# Patient Record
Sex: Male | Born: 1940
Health system: Southern US, Community
[De-identification: ages and names within clinical notes are randomized; demographics above are authoritative.]

## PROBLEM LIST (undated history)

## (undated) DIAGNOSIS — K219 Gastro-esophageal reflux disease without esophagitis: Secondary | ICD-10-CM

## (undated) DIAGNOSIS — I714 Abdominal aortic aneurysm, without rupture, unspecified: Secondary | ICD-10-CM

## (undated) DIAGNOSIS — I25119 Atherosclerotic heart disease of native coronary artery with unspecified angina pectoris: Secondary | ICD-10-CM

## (undated) DIAGNOSIS — E785 Hyperlipidemia, unspecified: Secondary | ICD-10-CM

## (undated) DIAGNOSIS — Z951 Presence of aortocoronary bypass graft: Secondary | ICD-10-CM

## (undated) DIAGNOSIS — R079 Chest pain, unspecified: Secondary | ICD-10-CM

## (undated) DIAGNOSIS — R0602 Shortness of breath: Secondary | ICD-10-CM

## (undated) DIAGNOSIS — R001 Bradycardia, unspecified: Secondary | ICD-10-CM

## (undated) DIAGNOSIS — I10 Essential (primary) hypertension: Secondary | ICD-10-CM

## (undated) DIAGNOSIS — I639 Cerebral infarction, unspecified: Secondary | ICD-10-CM

## (undated) DIAGNOSIS — K5090 Crohn's disease, unspecified, without complications: Secondary | ICD-10-CM

## (undated) DIAGNOSIS — J45909 Unspecified asthma, uncomplicated: Secondary | ICD-10-CM

## (undated) HISTORY — DX: Abdominal aortic aneurysm, without rupture, unspecified: I71.40

## (undated) HISTORY — DX: Presence of aortocoronary bypass graft: Z95.1

## (undated) HISTORY — PX: CARDIAC CATHETERIZATION: SHX172

## (undated) HISTORY — PX: CAROTID ENDARTERECTOMY: SUR193

## (undated) HISTORY — PX: CHOLECYSTECTOMY: SHX55

## (undated) HISTORY — DX: Bradycardia, unspecified: R00.1

## (undated) HISTORY — DX: Gastro-esophageal reflux disease without esophagitis: K21.9

## (undated) HISTORY — DX: Abdominal aortic aneurysm, without rupture: I71.4

## (undated) HISTORY — DX: Cerebral infarction, unspecified: I63.9

## (undated) HISTORY — DX: Hyperlipidemia, unspecified: E78.5

## (undated) HISTORY — PX: CORONARY ARTERY BYPASS GRAFT: SHX141

## (undated) HISTORY — PX: HERNIA REPAIR: SHX51

## (undated) HISTORY — DX: Shortness of breath: R06.02

## (undated) HISTORY — DX: Atherosclerotic heart disease of native coronary artery with unspecified angina pectoris: I25.119

## (undated) HISTORY — DX: Crohn's disease, unspecified, without complications: K50.90

## (undated) HISTORY — DX: Chest pain, unspecified: R07.9

## (undated) HISTORY — DX: Unspecified asthma, uncomplicated: J45.909

## (undated) HISTORY — DX: Essential (primary) hypertension: I10

---

## 1998-07-15 ENCOUNTER — Encounter: Payer: Self-pay | Admitting: Gastroenterology

## 1998-07-15 ENCOUNTER — Ambulatory Visit (HOSPITAL_COMMUNITY): Admission: RE | Admit: 1998-07-15 | Discharge: 1998-07-15 | Payer: Self-pay | Admitting: Gastroenterology

## 2002-01-22 ENCOUNTER — Encounter: Payer: Self-pay | Admitting: Orthopaedic Surgery

## 2002-01-25 ENCOUNTER — Observation Stay (HOSPITAL_COMMUNITY): Admission: RE | Admit: 2002-01-25 | Discharge: 2002-01-26 | Payer: Self-pay | Admitting: Orthopaedic Surgery

## 2002-01-25 ENCOUNTER — Encounter: Payer: Self-pay | Admitting: Orthopaedic Surgery

## 2008-07-29 ENCOUNTER — Encounter: Admission: RE | Admit: 2008-07-29 | Discharge: 2008-08-06 | Payer: Self-pay | Admitting: Neurology

## 2010-11-19 NOTE — Op Note (Signed)
[facility]  Patient:    James Armstrong, James Armstrong Visit Number: [id number] MRN: [id number]          Service Type: SUR Location: [id number] [id number] Attending Physician:  Patricia Nettle Dictated by:   Patricia Nettle, M.D. Proc. Date: 01/25/02 Admit Date:  01/25/2002 Discharge Date: 01/26/2002                             Operative Report  DATE OF BIRTH:  August 29, 1940  PREOPERATIVE DIAGNOSES:  Right L4-5 disk herniation and lateral recess stenosis with radiculopathy.  POSTOPERATIVE DIAGNOSES:  Right L4-5 disk herniation and lateral recess stenosis with radiculopathy.  PROCEDURE:  Right L4-5 microdiskectomy with lateral recess decompression.  SURGEON:  Patricia Nettle, M.D.  ASSISTANT:  Maud Deed, P.A.  COMPLICATIONS:  None.  ANESTHESIA:  General endotracheal.  INDICATIONS FOR PROCEDURE:  The patient is a 70 year old right hand dominant male who was bending over to pick a cord up off the floor and developed the sudden onset of back pain with right lower extremity radiculopathy. He initially treated with his primary physician trying physical therapy, anti-inflammatories and pain medication without significant relief. On physical examination, he was found to have a sciatic list to the right. He had pain with extension. The sciatic notch was tender. He had some give way weakness in the right Achilles. Reflexes were +1 at the knees and absent at the ankles. Straight leg raising was positive on the right. There was also a positive contralateral straight leg with right lower extremity pain. His plain radiographs show a lumbarized S1 segment. MRI scan showed a bulging disk on the right with lateral recessed stenosis and I am going to refer to this level as L4-5 which is the second to lowest caudal motion segment. He was sent for a selective nerve root injection on the right at the L4-5 level. This gave him a short period of relief although his symptoms returned. The  risks, benefits, and alternatives to microsurgical diskectomy with lateral recess decompression were reviewed extensively with the patient and he elected to proceed.  DESCRIPTION OF PROCEDURE:  The patient was properly identified in the holding area and taken to the operating room. He underwent general endotracheal anesthesia without difficulty. He was given prophylactic IV antibiotics. He was turned prone onto the Acromed four-poster positioning frame. His back was prepped and draped in the usual sterile fashion. All bony prominences were padded. The face and eyes were protected at all times. A 3 cm incision was made over the L4-5 level. Dissection was carried down to the deep fascia. The deep fascia was incised and the paraspinal muscles were stripped out of the facet joint at the L4-5 level. Care was taken to protect the facet joint capsule. We placed a curette under the lamina, took an x-ray confirming that this was under the L3 lamina. We then worked down identifying the L4-5 interspace. The microscope was then draped and brought onto the surgical field. Using a high speed bur, we removed the inferior third of the L4 lamina as well as the medial 1/3 of the L4-5 facet joint. The overhanging superior facet of L5 was then removed using a Kerrison rongeur. The ligamentum flavum was released and the nerve root was identified. It clearly was being compressed between the bulging disk anteriorly and the overhanging articular process. There was some discoloration to the nerve. We gently retracted the nerve medially, identified the  disk. Epidural bleeders were coagulated using bipolar cautery. The disk was incised and pituitary rongeurs were utilized to remove disk material. We used a hockey stick to confirm that the nerve was patent all the way out to the foramen. We irrigated the disk space with an angiocath floating above two small fragments and these were removed with the pituitary. When we  were done, the nerve was completely decompressed from its takeoff all the way out the foramen. We controlled all bleeding with bipolar electrocautery and Gelfoam. We placed 100 mcg of fentanyl directly in the epidural space. Gelfoam was placed over the nerve root. The deep fascia was closed with a running #1 Vicryl. The subcutaneous layer was closed with 2-0 Vicryl followed by a running 4-0 subcuticular Vicryl stitch. Benzoin and Steri-Strips were placed. A sterile dressing was applied. The patient was turned supine, extubated, able to wiggle his toes when he was returned to the recovery room in stable condition. Dictated by:   Patricia Nettle, M.D. Attending Physician:  Patricia Nettle. DD:  01/25/02 TD:  01/29/02 Job: [id number]

## 2010-11-19 NOTE — H&P (Signed)
[facility]  Patient:    James Armstrong, James Armstrong Visit Number: [id number] MRN: [id number]          Service Type: Attending:  Patricia Nettle, M.D. Dictated by:   Ralene Bathe, P.A.                           History and Physical  DATE OF BIRTH:  1940-08-06  CHIEF COMPLAINT:  Back and right leg pain.  HISTORY OF PRESENT ILLNESS:  The patient is a 70 year old male who has had approximately a three-month history of back and right leg pain following an incident at work where he bent over, felt a sudden onset of pain, and radiation into the right buttock.  He has had continued conservative management.  He continues to exhibit straight leg raise positive on the right side.  His MRI has demonstrated a right L5-S1 disk herniation with lateral recess stenosis also noted.  There is S1 radiculopathy noted.  He has had a selective nerve root block which proved to be of no benefit.  At this time he wishes to proceed with surgical intervention as his pain is difficult to control and is affecting his activities of daily living.  The risks and benefits were discussed, and he wishes to proceed.  PAST MEDICAL HISTORY: 1. Hypercholesterolemia. 2. Hypertension. 3. Peripheral vascular disease, status post right carotid endarterectomy. 4. GERD.  PAST SURGICAL HISTORY:  Right carotid endarterectomy.  MEDICATIONS: 1. Lisinopril 10 mg q.d. 2. Lipitor 40 mg q.d. 3. Prevacid 15 mg q.d.  ALLERGIES:  No known drug allergies.  SOCIAL HISTORY:  Does not smoke nor drink.  He is married.  Will have some help postoperatively.  FAMILY HISTORY:  Negative for cardiac, diabetes, or cancer.  REVIEW OF SYSTEMS:  The patient denies any recent fevers, chills, night sweats.  No bleeding tendencies.  CNS:  No blurred vision, headaches, seizures, paralysis.  RESPIRATORY:  No shortness of breath, productive cough, hemoptysis.   CARDIOVASCULAR:  No chest pain, angina,  vomiting. GASTROINTESTINAL:  No nausea, vomiting, constipation, melena, bloody stools. GENITOURINARY:  No dysuria, no hematuria.  MUSCULOSKELETAL:  As pertinent to present illness.  PHYSICAL EXAMINATION:  GENERAL:  Well-developed, well-nourished 70 year old male.  VITAL SIGNS:  Blood pressure 146/68 today.  HEENT:  Normocephalic.  Extraocular motions intact.  NECK:  Supple.  No lymphadenopathy.  No carotid bruits appreciated bilaterally.  There is a well-healed scar on the right side there.  CHEST:  Clear to auscultation bilaterally.  No rales, no rhonchi.  HEART:  Regular rate and rhythm.  No murmurs, gallops, or rubs.  ABDOMEN:  Positive bowel sounds.  Soft and nontender.  EXTREMITIES:  ______.  IMPRESSION: 1. Herniated nucleus pulposus. 2. Hypercholesterolemia. 3. Hypertension. 4. Peripheral vascular disease, status post right carotid endarterectomy. 5. Gastroesophageal reflux disease.  PLAN:  Microdiskectomy as above. Dictated by:   Ralene Bathe, P.A. Attending:  Patricia Nettle, M.D. DD:  01/18/02 TD:  01/22/02 Job: [id number]

## 2011-07-12 DIAGNOSIS — J069 Acute upper respiratory infection, unspecified: Secondary | ICD-10-CM | POA: Diagnosis not present

## 2011-07-12 DIAGNOSIS — Z6828 Body mass index (BMI) 28.0-28.9, adult: Secondary | ICD-10-CM | POA: Diagnosis not present

## 2011-07-26 DIAGNOSIS — E785 Hyperlipidemia, unspecified: Secondary | ICD-10-CM | POA: Diagnosis not present

## 2011-07-26 DIAGNOSIS — I10 Essential (primary) hypertension: Secondary | ICD-10-CM | POA: Diagnosis not present

## 2011-07-26 DIAGNOSIS — Z79899 Other long term (current) drug therapy: Secondary | ICD-10-CM | POA: Diagnosis not present

## 2011-07-26 DIAGNOSIS — I2510 Atherosclerotic heart disease of native coronary artery without angina pectoris: Secondary | ICD-10-CM | POA: Diagnosis not present

## 2011-07-26 DIAGNOSIS — Z6827 Body mass index (BMI) 27.0-27.9, adult: Secondary | ICD-10-CM | POA: Diagnosis not present

## 2011-07-26 DIAGNOSIS — Z125 Encounter for screening for malignant neoplasm of prostate: Secondary | ICD-10-CM | POA: Diagnosis not present

## 2012-01-07 DIAGNOSIS — J209 Acute bronchitis, unspecified: Secondary | ICD-10-CM | POA: Diagnosis not present

## 2012-01-09 DIAGNOSIS — Z6828 Body mass index (BMI) 28.0-28.9, adult: Secondary | ICD-10-CM | POA: Diagnosis not present

## 2012-01-09 DIAGNOSIS — J209 Acute bronchitis, unspecified: Secondary | ICD-10-CM | POA: Diagnosis not present

## 2012-01-10 DIAGNOSIS — J40 Bronchitis, not specified as acute or chronic: Secondary | ICD-10-CM | POA: Diagnosis not present

## 2012-01-26 DIAGNOSIS — Z6826 Body mass index (BMI) 26.0-26.9, adult: Secondary | ICD-10-CM | POA: Diagnosis not present

## 2012-01-26 DIAGNOSIS — I10 Essential (primary) hypertension: Secondary | ICD-10-CM | POA: Diagnosis not present

## 2012-01-26 DIAGNOSIS — I2510 Atherosclerotic heart disease of native coronary artery without angina pectoris: Secondary | ICD-10-CM | POA: Diagnosis not present

## 2012-01-26 DIAGNOSIS — E785 Hyperlipidemia, unspecified: Secondary | ICD-10-CM | POA: Diagnosis not present

## 2012-05-01 DIAGNOSIS — E785 Hyperlipidemia, unspecified: Secondary | ICD-10-CM | POA: Diagnosis not present

## 2012-05-01 DIAGNOSIS — I10 Essential (primary) hypertension: Secondary | ICD-10-CM | POA: Diagnosis not present

## 2012-05-01 DIAGNOSIS — E119 Type 2 diabetes mellitus without complications: Secondary | ICD-10-CM | POA: Diagnosis not present

## 2012-05-01 DIAGNOSIS — I2510 Atherosclerotic heart disease of native coronary artery without angina pectoris: Secondary | ICD-10-CM | POA: Diagnosis not present

## 2012-05-18 DIAGNOSIS — M7660 Achilles tendinitis, unspecified leg: Secondary | ICD-10-CM | POA: Diagnosis not present

## 2012-05-18 DIAGNOSIS — Z6828 Body mass index (BMI) 28.0-28.9, adult: Secondary | ICD-10-CM | POA: Diagnosis not present

## 2012-07-30 DIAGNOSIS — Z79899 Other long term (current) drug therapy: Secondary | ICD-10-CM | POA: Diagnosis not present

## 2012-07-30 DIAGNOSIS — I6789 Other cerebrovascular disease: Secondary | ICD-10-CM | POA: Diagnosis not present

## 2012-07-30 DIAGNOSIS — Z125 Encounter for screening for malignant neoplasm of prostate: Secondary | ICD-10-CM | POA: Diagnosis not present

## 2012-07-30 DIAGNOSIS — E785 Hyperlipidemia, unspecified: Secondary | ICD-10-CM | POA: Diagnosis not present

## 2012-07-30 DIAGNOSIS — I10 Essential (primary) hypertension: Secondary | ICD-10-CM | POA: Diagnosis not present

## 2012-07-30 DIAGNOSIS — I2510 Atherosclerotic heart disease of native coronary artery without angina pectoris: Secondary | ICD-10-CM | POA: Diagnosis not present

## 2012-08-20 DIAGNOSIS — I714 Abdominal aortic aneurysm, without rupture: Secondary | ICD-10-CM | POA: Diagnosis not present

## 2012-08-20 DIAGNOSIS — I7102 Dissection of abdominal aorta: Secondary | ICD-10-CM | POA: Diagnosis not present

## 2012-08-20 DIAGNOSIS — I739 Peripheral vascular disease, unspecified: Secondary | ICD-10-CM | POA: Diagnosis not present

## 2012-08-20 DIAGNOSIS — I700 Atherosclerosis of aorta: Secondary | ICD-10-CM | POA: Diagnosis not present

## 2013-01-31 DIAGNOSIS — Z6826 Body mass index (BMI) 26.0-26.9, adult: Secondary | ICD-10-CM | POA: Diagnosis not present

## 2013-01-31 DIAGNOSIS — Z9181 History of falling: Secondary | ICD-10-CM | POA: Diagnosis not present

## 2013-01-31 DIAGNOSIS — Z79899 Other long term (current) drug therapy: Secondary | ICD-10-CM | POA: Diagnosis not present

## 2013-01-31 DIAGNOSIS — I10 Essential (primary) hypertension: Secondary | ICD-10-CM | POA: Diagnosis not present

## 2013-01-31 DIAGNOSIS — I2510 Atherosclerotic heart disease of native coronary artery without angina pectoris: Secondary | ICD-10-CM | POA: Diagnosis not present

## 2013-01-31 DIAGNOSIS — Z1331 Encounter for screening for depression: Secondary | ICD-10-CM | POA: Diagnosis not present

## 2013-01-31 DIAGNOSIS — E785 Hyperlipidemia, unspecified: Secondary | ICD-10-CM | POA: Diagnosis not present

## 2013-03-26 DIAGNOSIS — H6120 Impacted cerumen, unspecified ear: Secondary | ICD-10-CM | POA: Diagnosis not present

## 2013-03-26 DIAGNOSIS — J019 Acute sinusitis, unspecified: Secondary | ICD-10-CM | POA: Diagnosis not present

## 2013-03-26 DIAGNOSIS — Z6826 Body mass index (BMI) 26.0-26.9, adult: Secondary | ICD-10-CM | POA: Diagnosis not present

## 2013-03-26 DIAGNOSIS — H6690 Otitis media, unspecified, unspecified ear: Secondary | ICD-10-CM | POA: Diagnosis not present

## 2013-04-24 DIAGNOSIS — Z23 Encounter for immunization: Secondary | ICD-10-CM | POA: Diagnosis not present

## 2013-05-21 DIAGNOSIS — I714 Abdominal aortic aneurysm, without rupture: Secondary | ICD-10-CM | POA: Diagnosis not present

## 2013-05-21 DIAGNOSIS — I10 Essential (primary) hypertension: Secondary | ICD-10-CM | POA: Diagnosis not present

## 2013-05-21 DIAGNOSIS — E785 Hyperlipidemia, unspecified: Secondary | ICD-10-CM | POA: Diagnosis not present

## 2013-05-24 DIAGNOSIS — J209 Acute bronchitis, unspecified: Secondary | ICD-10-CM | POA: Diagnosis not present

## 2013-05-24 DIAGNOSIS — Z6827 Body mass index (BMI) 27.0-27.9, adult: Secondary | ICD-10-CM | POA: Diagnosis not present

## 2013-05-24 DIAGNOSIS — J019 Acute sinusitis, unspecified: Secondary | ICD-10-CM | POA: Diagnosis not present

## 2013-06-28 DIAGNOSIS — J209 Acute bronchitis, unspecified: Secondary | ICD-10-CM | POA: Diagnosis not present

## 2013-06-28 DIAGNOSIS — Z6827 Body mass index (BMI) 27.0-27.9, adult: Secondary | ICD-10-CM | POA: Diagnosis not present

## 2013-07-01 DIAGNOSIS — J209 Acute bronchitis, unspecified: Secondary | ICD-10-CM | POA: Diagnosis not present

## 2013-07-09 DIAGNOSIS — R05 Cough: Secondary | ICD-10-CM | POA: Diagnosis not present

## 2013-07-09 DIAGNOSIS — J3089 Other allergic rhinitis: Secondary | ICD-10-CM | POA: Diagnosis not present

## 2013-07-09 DIAGNOSIS — R059 Cough, unspecified: Secondary | ICD-10-CM | POA: Diagnosis not present

## 2013-07-09 DIAGNOSIS — K219 Gastro-esophageal reflux disease without esophagitis: Secondary | ICD-10-CM | POA: Diagnosis not present

## 2013-07-09 DIAGNOSIS — J45909 Unspecified asthma, uncomplicated: Secondary | ICD-10-CM | POA: Diagnosis not present

## 2013-07-18 DIAGNOSIS — R059 Cough, unspecified: Secondary | ICD-10-CM | POA: Diagnosis not present

## 2013-07-18 DIAGNOSIS — J3089 Other allergic rhinitis: Secondary | ICD-10-CM | POA: Diagnosis not present

## 2013-07-18 DIAGNOSIS — J45909 Unspecified asthma, uncomplicated: Secondary | ICD-10-CM | POA: Diagnosis not present

## 2013-07-18 DIAGNOSIS — K219 Gastro-esophageal reflux disease without esophagitis: Secondary | ICD-10-CM | POA: Diagnosis not present

## 2013-07-18 DIAGNOSIS — R05 Cough: Secondary | ICD-10-CM | POA: Diagnosis not present

## 2013-08-09 DIAGNOSIS — I10 Essential (primary) hypertension: Secondary | ICD-10-CM | POA: Diagnosis not present

## 2013-08-09 DIAGNOSIS — Z79899 Other long term (current) drug therapy: Secondary | ICD-10-CM | POA: Diagnosis not present

## 2013-08-09 DIAGNOSIS — Z125 Encounter for screening for malignant neoplasm of prostate: Secondary | ICD-10-CM | POA: Diagnosis not present

## 2013-08-09 DIAGNOSIS — Z6826 Body mass index (BMI) 26.0-26.9, adult: Secondary | ICD-10-CM | POA: Diagnosis not present

## 2013-08-09 DIAGNOSIS — I2510 Atherosclerotic heart disease of native coronary artery without angina pectoris: Secondary | ICD-10-CM | POA: Diagnosis not present

## 2013-08-09 DIAGNOSIS — E785 Hyperlipidemia, unspecified: Secondary | ICD-10-CM | POA: Diagnosis not present

## 2013-08-12 DIAGNOSIS — H4310 Vitreous hemorrhage, unspecified eye: Secondary | ICD-10-CM | POA: Diagnosis not present

## 2013-08-12 DIAGNOSIS — H43819 Vitreous degeneration, unspecified eye: Secondary | ICD-10-CM | POA: Diagnosis not present

## 2013-08-13 DIAGNOSIS — I714 Abdominal aortic aneurysm, without rupture, unspecified: Secondary | ICD-10-CM | POA: Diagnosis not present

## 2013-09-02 DIAGNOSIS — H43819 Vitreous degeneration, unspecified eye: Secondary | ICD-10-CM | POA: Diagnosis not present

## 2013-09-02 DIAGNOSIS — H4310 Vitreous hemorrhage, unspecified eye: Secondary | ICD-10-CM | POA: Diagnosis not present

## 2013-10-04 DIAGNOSIS — H8309 Labyrinthitis, unspecified ear: Secondary | ICD-10-CM | POA: Diagnosis not present

## 2013-10-23 DIAGNOSIS — K219 Gastro-esophageal reflux disease without esophagitis: Secondary | ICD-10-CM | POA: Diagnosis not present

## 2013-10-23 DIAGNOSIS — J3089 Other allergic rhinitis: Secondary | ICD-10-CM | POA: Diagnosis not present

## 2013-10-23 DIAGNOSIS — J45909 Unspecified asthma, uncomplicated: Secondary | ICD-10-CM | POA: Diagnosis not present

## 2013-12-18 DIAGNOSIS — M6580 Other synovitis and tenosynovitis, unspecified site: Secondary | ICD-10-CM | POA: Diagnosis not present

## 2013-12-18 DIAGNOSIS — Z6827 Body mass index (BMI) 27.0-27.9, adult: Secondary | ICD-10-CM | POA: Diagnosis not present

## 2013-12-18 DIAGNOSIS — M25569 Pain in unspecified knee: Secondary | ICD-10-CM | POA: Diagnosis not present

## 2013-12-23 DIAGNOSIS — M159 Polyosteoarthritis, unspecified: Secondary | ICD-10-CM | POA: Diagnosis not present

## 2013-12-23 DIAGNOSIS — M25569 Pain in unspecified knee: Secondary | ICD-10-CM | POA: Diagnosis not present

## 2013-12-23 DIAGNOSIS — Z6826 Body mass index (BMI) 26.0-26.9, adult: Secondary | ICD-10-CM | POA: Diagnosis not present

## 2014-02-07 DIAGNOSIS — E785 Hyperlipidemia, unspecified: Secondary | ICD-10-CM | POA: Diagnosis not present

## 2014-02-07 DIAGNOSIS — I2510 Atherosclerotic heart disease of native coronary artery without angina pectoris: Secondary | ICD-10-CM | POA: Diagnosis not present

## 2014-02-07 DIAGNOSIS — Z79899 Other long term (current) drug therapy: Secondary | ICD-10-CM | POA: Diagnosis not present

## 2014-02-07 DIAGNOSIS — Z6826 Body mass index (BMI) 26.0-26.9, adult: Secondary | ICD-10-CM | POA: Diagnosis not present

## 2014-02-07 DIAGNOSIS — I10 Essential (primary) hypertension: Secondary | ICD-10-CM | POA: Diagnosis not present

## 2014-02-21 DIAGNOSIS — J209 Acute bronchitis, unspecified: Secondary | ICD-10-CM | POA: Diagnosis not present

## 2014-02-21 DIAGNOSIS — Z6826 Body mass index (BMI) 26.0-26.9, adult: Secondary | ICD-10-CM | POA: Diagnosis not present

## 2014-05-15 DIAGNOSIS — Z7982 Long term (current) use of aspirin: Secondary | ICD-10-CM | POA: Diagnosis not present

## 2014-05-15 DIAGNOSIS — E860 Dehydration: Secondary | ICD-10-CM | POA: Diagnosis not present

## 2014-05-15 DIAGNOSIS — Z951 Presence of aortocoronary bypass graft: Secondary | ICD-10-CM | POA: Diagnosis not present

## 2014-05-15 DIAGNOSIS — R072 Precordial pain: Secondary | ICD-10-CM | POA: Diagnosis not present

## 2014-05-15 DIAGNOSIS — Z79899 Other long term (current) drug therapy: Secondary | ICD-10-CM | POA: Diagnosis not present

## 2014-05-15 DIAGNOSIS — I2510 Atherosclerotic heart disease of native coronary artery without angina pectoris: Secondary | ICD-10-CM | POA: Diagnosis not present

## 2014-05-15 DIAGNOSIS — Z8673 Personal history of transient ischemic attack (TIA), and cerebral infarction without residual deficits: Secondary | ICD-10-CM | POA: Diagnosis not present

## 2014-05-15 DIAGNOSIS — Z7902 Long term (current) use of antithrombotics/antiplatelets: Secondary | ICD-10-CM | POA: Diagnosis not present

## 2014-05-15 DIAGNOSIS — I10 Essential (primary) hypertension: Secondary | ICD-10-CM | POA: Diagnosis not present

## 2014-05-15 DIAGNOSIS — Z23 Encounter for immunization: Secondary | ICD-10-CM | POA: Diagnosis not present

## 2014-05-15 DIAGNOSIS — E785 Hyperlipidemia, unspecified: Secondary | ICD-10-CM | POA: Diagnosis not present

## 2014-05-15 DIAGNOSIS — R079 Chest pain, unspecified: Secondary | ICD-10-CM | POA: Diagnosis not present

## 2014-05-16 DIAGNOSIS — I2510 Atherosclerotic heart disease of native coronary artery without angina pectoris: Secondary | ICD-10-CM | POA: Diagnosis not present

## 2014-05-16 DIAGNOSIS — I10 Essential (primary) hypertension: Secondary | ICD-10-CM | POA: Diagnosis not present

## 2014-05-16 DIAGNOSIS — E785 Hyperlipidemia, unspecified: Secondary | ICD-10-CM | POA: Diagnosis not present

## 2014-05-16 DIAGNOSIS — R079 Chest pain, unspecified: Secondary | ICD-10-CM | POA: Diagnosis not present

## 2014-05-16 DIAGNOSIS — R0789 Other chest pain: Secondary | ICD-10-CM | POA: Diagnosis not present

## 2014-05-28 DIAGNOSIS — Z1389 Encounter for screening for other disorder: Secondary | ICD-10-CM | POA: Diagnosis not present

## 2014-05-28 DIAGNOSIS — Z6827 Body mass index (BMI) 27.0-27.9, adult: Secondary | ICD-10-CM | POA: Diagnosis not present

## 2014-05-28 DIAGNOSIS — Z9181 History of falling: Secondary | ICD-10-CM | POA: Diagnosis not present

## 2014-05-28 DIAGNOSIS — I10 Essential (primary) hypertension: Secondary | ICD-10-CM | POA: Diagnosis not present

## 2014-05-28 DIAGNOSIS — R079 Chest pain, unspecified: Secondary | ICD-10-CM | POA: Diagnosis not present

## 2014-05-28 DIAGNOSIS — I2510 Atherosclerotic heart disease of native coronary artery without angina pectoris: Secondary | ICD-10-CM | POA: Diagnosis not present

## 2014-05-28 DIAGNOSIS — E785 Hyperlipidemia, unspecified: Secondary | ICD-10-CM | POA: Diagnosis not present

## 2014-05-28 DIAGNOSIS — Z23 Encounter for immunization: Secondary | ICD-10-CM | POA: Diagnosis not present

## 2014-08-13 DIAGNOSIS — I10 Essential (primary) hypertension: Secondary | ICD-10-CM | POA: Diagnosis not present

## 2014-08-13 DIAGNOSIS — E785 Hyperlipidemia, unspecified: Secondary | ICD-10-CM | POA: Diagnosis not present

## 2014-08-13 DIAGNOSIS — Z125 Encounter for screening for malignant neoplasm of prostate: Secondary | ICD-10-CM | POA: Diagnosis not present

## 2014-08-13 DIAGNOSIS — Z6827 Body mass index (BMI) 27.0-27.9, adult: Secondary | ICD-10-CM | POA: Diagnosis not present

## 2014-08-13 DIAGNOSIS — I2510 Atherosclerotic heart disease of native coronary artery without angina pectoris: Secondary | ICD-10-CM | POA: Diagnosis not present

## 2014-08-13 DIAGNOSIS — I638 Other cerebral infarction: Secondary | ICD-10-CM | POA: Diagnosis not present

## 2014-08-13 DIAGNOSIS — Z79899 Other long term (current) drug therapy: Secondary | ICD-10-CM | POA: Diagnosis not present

## 2014-08-28 DIAGNOSIS — Z6827 Body mass index (BMI) 27.0-27.9, adult: Secondary | ICD-10-CM | POA: Diagnosis not present

## 2014-08-28 DIAGNOSIS — R1013 Epigastric pain: Secondary | ICD-10-CM | POA: Diagnosis not present

## 2014-09-08 DIAGNOSIS — Z6827 Body mass index (BMI) 27.0-27.9, adult: Secondary | ICD-10-CM | POA: Diagnosis not present

## 2014-09-08 DIAGNOSIS — J208 Acute bronchitis due to other specified organisms: Secondary | ICD-10-CM | POA: Diagnosis not present

## 2014-12-30 DIAGNOSIS — K5730 Diverticulosis of large intestine without perforation or abscess without bleeding: Secondary | ICD-10-CM | POA: Diagnosis not present

## 2014-12-30 DIAGNOSIS — K529 Noninfective gastroenteritis and colitis, unspecified: Secondary | ICD-10-CM | POA: Diagnosis not present

## 2014-12-30 DIAGNOSIS — Z6826 Body mass index (BMI) 26.0-26.9, adult: Secondary | ICD-10-CM | POA: Diagnosis not present

## 2014-12-30 DIAGNOSIS — K5792 Diverticulitis of intestine, part unspecified, without perforation or abscess without bleeding: Secondary | ICD-10-CM | POA: Diagnosis not present

## 2014-12-30 DIAGNOSIS — K8020 Calculus of gallbladder without cholecystitis without obstruction: Secondary | ICD-10-CM | POA: Diagnosis not present

## 2014-12-30 DIAGNOSIS — I714 Abdominal aortic aneurysm, without rupture: Secondary | ICD-10-CM | POA: Diagnosis not present

## 2015-02-16 DIAGNOSIS — I639 Cerebral infarction, unspecified: Secondary | ICD-10-CM | POA: Diagnosis not present

## 2015-02-16 DIAGNOSIS — I2510 Atherosclerotic heart disease of native coronary artery without angina pectoris: Secondary | ICD-10-CM | POA: Diagnosis not present

## 2015-02-16 DIAGNOSIS — Z6826 Body mass index (BMI) 26.0-26.9, adult: Secondary | ICD-10-CM | POA: Diagnosis not present

## 2015-02-16 DIAGNOSIS — Z1389 Encounter for screening for other disorder: Secondary | ICD-10-CM | POA: Diagnosis not present

## 2015-02-16 DIAGNOSIS — E785 Hyperlipidemia, unspecified: Secondary | ICD-10-CM | POA: Diagnosis not present

## 2015-02-16 DIAGNOSIS — Z9181 History of falling: Secondary | ICD-10-CM | POA: Diagnosis not present

## 2015-02-16 DIAGNOSIS — Z79899 Other long term (current) drug therapy: Secondary | ICD-10-CM | POA: Diagnosis not present

## 2015-02-16 DIAGNOSIS — I10 Essential (primary) hypertension: Secondary | ICD-10-CM | POA: Diagnosis not present

## 2015-02-18 DIAGNOSIS — H43812 Vitreous degeneration, left eye: Secondary | ICD-10-CM | POA: Diagnosis not present

## 2015-02-18 DIAGNOSIS — H2513 Age-related nuclear cataract, bilateral: Secondary | ICD-10-CM | POA: Diagnosis not present

## 2015-05-01 DIAGNOSIS — Z23 Encounter for immunization: Secondary | ICD-10-CM | POA: Diagnosis not present

## 2015-05-20 DIAGNOSIS — K810 Acute cholecystitis: Secondary | ICD-10-CM | POA: Diagnosis not present

## 2015-05-20 DIAGNOSIS — R1084 Generalized abdominal pain: Secondary | ICD-10-CM | POA: Diagnosis not present

## 2015-05-21 DIAGNOSIS — K8010 Calculus of gallbladder with chronic cholecystitis without obstruction: Secondary | ICD-10-CM | POA: Diagnosis not present

## 2015-05-21 DIAGNOSIS — K8020 Calculus of gallbladder without cholecystitis without obstruction: Secondary | ICD-10-CM | POA: Diagnosis not present

## 2015-06-09 DIAGNOSIS — I25119 Atherosclerotic heart disease of native coronary artery with unspecified angina pectoris: Secondary | ICD-10-CM | POA: Diagnosis not present

## 2015-06-09 DIAGNOSIS — R1011 Right upper quadrant pain: Secondary | ICD-10-CM | POA: Diagnosis not present

## 2015-06-09 DIAGNOSIS — Z6826 Body mass index (BMI) 26.0-26.9, adult: Secondary | ICD-10-CM | POA: Diagnosis not present

## 2015-06-09 DIAGNOSIS — K8010 Calculus of gallbladder with chronic cholecystitis without obstruction: Secondary | ICD-10-CM | POA: Diagnosis not present

## 2015-06-15 DIAGNOSIS — I252 Old myocardial infarction: Secondary | ICD-10-CM | POA: Diagnosis not present

## 2015-06-15 DIAGNOSIS — Z8673 Personal history of transient ischemic attack (TIA), and cerebral infarction without residual deficits: Secondary | ICD-10-CM | POA: Diagnosis not present

## 2015-06-15 DIAGNOSIS — I2510 Atherosclerotic heart disease of native coronary artery without angina pectoris: Secondary | ICD-10-CM | POA: Diagnosis not present

## 2015-06-15 DIAGNOSIS — Z9049 Acquired absence of other specified parts of digestive tract: Secondary | ICD-10-CM | POA: Diagnosis not present

## 2015-06-15 DIAGNOSIS — E785 Hyperlipidemia, unspecified: Secondary | ICD-10-CM | POA: Diagnosis not present

## 2015-06-15 DIAGNOSIS — Z79899 Other long term (current) drug therapy: Secondary | ICD-10-CM | POA: Diagnosis not present

## 2015-06-15 DIAGNOSIS — K8010 Calculus of gallbladder with chronic cholecystitis without obstruction: Secondary | ICD-10-CM | POA: Diagnosis not present

## 2015-06-15 DIAGNOSIS — K219 Gastro-esophageal reflux disease without esophagitis: Secondary | ICD-10-CM | POA: Diagnosis not present

## 2015-06-15 DIAGNOSIS — I10 Essential (primary) hypertension: Secondary | ICD-10-CM | POA: Diagnosis not present

## 2015-06-15 DIAGNOSIS — Z951 Presence of aortocoronary bypass graft: Secondary | ICD-10-CM | POA: Diagnosis not present

## 2015-06-15 DIAGNOSIS — J45909 Unspecified asthma, uncomplicated: Secondary | ICD-10-CM | POA: Diagnosis not present

## 2015-08-19 DIAGNOSIS — I2510 Atherosclerotic heart disease of native coronary artery without angina pectoris: Secondary | ICD-10-CM | POA: Diagnosis not present

## 2015-08-19 DIAGNOSIS — Z1389 Encounter for screening for other disorder: Secondary | ICD-10-CM | POA: Diagnosis not present

## 2015-08-19 DIAGNOSIS — E785 Hyperlipidemia, unspecified: Secondary | ICD-10-CM | POA: Diagnosis not present

## 2015-08-19 DIAGNOSIS — I639 Cerebral infarction, unspecified: Secondary | ICD-10-CM | POA: Diagnosis not present

## 2015-08-19 DIAGNOSIS — Z79899 Other long term (current) drug therapy: Secondary | ICD-10-CM | POA: Diagnosis not present

## 2015-08-19 DIAGNOSIS — Z6826 Body mass index (BMI) 26.0-26.9, adult: Secondary | ICD-10-CM | POA: Diagnosis not present

## 2015-08-19 DIAGNOSIS — Z125 Encounter for screening for malignant neoplasm of prostate: Secondary | ICD-10-CM | POA: Diagnosis not present

## 2015-08-19 DIAGNOSIS — I10 Essential (primary) hypertension: Secondary | ICD-10-CM | POA: Diagnosis not present

## 2015-09-20 DIAGNOSIS — J209 Acute bronchitis, unspecified: Secondary | ICD-10-CM | POA: Diagnosis not present

## 2015-09-21 DIAGNOSIS — Z6826 Body mass index (BMI) 26.0-26.9, adult: Secondary | ICD-10-CM | POA: Diagnosis not present

## 2015-09-21 DIAGNOSIS — J208 Acute bronchitis due to other specified organisms: Secondary | ICD-10-CM | POA: Diagnosis not present

## 2016-02-16 DIAGNOSIS — I2510 Atherosclerotic heart disease of native coronary artery without angina pectoris: Secondary | ICD-10-CM | POA: Diagnosis not present

## 2016-02-16 DIAGNOSIS — I639 Cerebral infarction, unspecified: Secondary | ICD-10-CM | POA: Diagnosis not present

## 2016-02-16 DIAGNOSIS — Z79899 Other long term (current) drug therapy: Secondary | ICD-10-CM | POA: Diagnosis not present

## 2016-02-16 DIAGNOSIS — I10 Essential (primary) hypertension: Secondary | ICD-10-CM | POA: Diagnosis not present

## 2016-02-16 DIAGNOSIS — E785 Hyperlipidemia, unspecified: Secondary | ICD-10-CM | POA: Diagnosis not present

## 2016-02-16 DIAGNOSIS — Z6826 Body mass index (BMI) 26.0-26.9, adult: Secondary | ICD-10-CM | POA: Diagnosis not present

## 2016-02-24 DIAGNOSIS — I2510 Atherosclerotic heart disease of native coronary artery without angina pectoris: Secondary | ICD-10-CM | POA: Diagnosis not present

## 2016-02-24 DIAGNOSIS — I25119 Atherosclerotic heart disease of native coronary artery with unspecified angina pectoris: Secondary | ICD-10-CM

## 2016-02-24 DIAGNOSIS — I10 Essential (primary) hypertension: Secondary | ICD-10-CM

## 2016-02-24 DIAGNOSIS — E785 Hyperlipidemia, unspecified: Secondary | ICD-10-CM

## 2016-02-24 DIAGNOSIS — I6529 Occlusion and stenosis of unspecified carotid artery: Secondary | ICD-10-CM | POA: Diagnosis not present

## 2016-02-24 DIAGNOSIS — Z951 Presence of aortocoronary bypass graft: Secondary | ICD-10-CM

## 2016-02-24 DIAGNOSIS — I714 Abdominal aortic aneurysm, without rupture, unspecified: Secondary | ICD-10-CM

## 2016-02-24 DIAGNOSIS — Z6825 Body mass index (BMI) 25.0-25.9, adult: Secondary | ICD-10-CM | POA: Diagnosis not present

## 2016-02-24 DIAGNOSIS — I25110 Atherosclerotic heart disease of native coronary artery with unstable angina pectoris: Secondary | ICD-10-CM

## 2016-02-24 HISTORY — DX: Atherosclerotic heart disease of native coronary artery with unstable angina pectoris: I25.110

## 2016-02-24 HISTORY — DX: Atherosclerotic heart disease of native coronary artery with unspecified angina pectoris: I25.119

## 2016-02-24 HISTORY — DX: Abdominal aortic aneurysm, without rupture, unspecified: I71.40

## 2016-02-24 HISTORY — DX: Presence of aortocoronary bypass graft: Z95.1

## 2016-02-24 HISTORY — DX: Hyperlipidemia, unspecified: E78.5

## 2016-02-24 HISTORY — DX: Abdominal aortic aneurysm, without rupture: I71.4

## 2016-02-24 HISTORY — DX: Essential (primary) hypertension: I10

## 2016-03-09 DIAGNOSIS — I6529 Occlusion and stenosis of unspecified carotid artery: Secondary | ICD-10-CM | POA: Diagnosis not present

## 2016-03-09 DIAGNOSIS — I714 Abdominal aortic aneurysm, without rupture: Secondary | ICD-10-CM | POA: Diagnosis not present

## 2016-05-03 DIAGNOSIS — Z23 Encounter for immunization: Secondary | ICD-10-CM | POA: Diagnosis not present

## 2016-07-20 DIAGNOSIS — H2513 Age-related nuclear cataract, bilateral: Secondary | ICD-10-CM | POA: Diagnosis not present

## 2016-07-20 DIAGNOSIS — H524 Presbyopia: Secondary | ICD-10-CM | POA: Diagnosis not present

## 2016-08-03 DIAGNOSIS — I2510 Atherosclerotic heart disease of native coronary artery without angina pectoris: Secondary | ICD-10-CM | POA: Diagnosis not present

## 2016-08-03 DIAGNOSIS — Z6825 Body mass index (BMI) 25.0-25.9, adult: Secondary | ICD-10-CM | POA: Diagnosis not present

## 2016-08-03 DIAGNOSIS — I517 Cardiomegaly: Secondary | ICD-10-CM | POA: Diagnosis not present

## 2016-08-03 DIAGNOSIS — R0602 Shortness of breath: Secondary | ICD-10-CM | POA: Diagnosis not present

## 2016-08-03 DIAGNOSIS — R0609 Other forms of dyspnea: Secondary | ICD-10-CM | POA: Diagnosis not present

## 2016-08-06 DIAGNOSIS — I10 Essential (primary) hypertension: Secondary | ICD-10-CM | POA: Diagnosis not present

## 2016-08-06 DIAGNOSIS — R5383 Other fatigue: Secondary | ICD-10-CM | POA: Diagnosis not present

## 2016-08-06 DIAGNOSIS — Z951 Presence of aortocoronary bypass graft: Secondary | ICD-10-CM | POA: Diagnosis not present

## 2016-08-06 DIAGNOSIS — I2582 Chronic total occlusion of coronary artery: Secondary | ICD-10-CM | POA: Diagnosis not present

## 2016-08-06 DIAGNOSIS — E785 Hyperlipidemia, unspecified: Secondary | ICD-10-CM | POA: Diagnosis not present

## 2016-08-06 DIAGNOSIS — Z6825 Body mass index (BMI) 25.0-25.9, adult: Secondary | ICD-10-CM | POA: Diagnosis not present

## 2016-08-06 DIAGNOSIS — I25119 Atherosclerotic heart disease of native coronary artery with unspecified angina pectoris: Secondary | ICD-10-CM | POA: Diagnosis not present

## 2016-08-06 DIAGNOSIS — K5090 Crohn's disease, unspecified, without complications: Secondary | ICD-10-CM | POA: Diagnosis not present

## 2016-08-06 DIAGNOSIS — R0602 Shortness of breath: Secondary | ICD-10-CM | POA: Diagnosis not present

## 2016-08-06 DIAGNOSIS — R200 Anesthesia of skin: Secondary | ICD-10-CM | POA: Diagnosis not present

## 2016-08-06 DIAGNOSIS — R001 Bradycardia, unspecified: Secondary | ICD-10-CM

## 2016-08-06 DIAGNOSIS — I25118 Atherosclerotic heart disease of native coronary artery with other forms of angina pectoris: Secondary | ICD-10-CM | POA: Diagnosis not present

## 2016-08-06 DIAGNOSIS — K219 Gastro-esophageal reflux disease without esophagitis: Secondary | ICD-10-CM | POA: Diagnosis not present

## 2016-08-06 HISTORY — DX: Shortness of breath: R06.02

## 2016-08-06 HISTORY — DX: Bradycardia, unspecified: R00.1

## 2016-08-07 DIAGNOSIS — E785 Hyperlipidemia, unspecified: Secondary | ICD-10-CM | POA: Diagnosis not present

## 2016-08-07 DIAGNOSIS — Z951 Presence of aortocoronary bypass graft: Secondary | ICD-10-CM | POA: Diagnosis not present

## 2016-08-07 DIAGNOSIS — R0602 Shortness of breath: Secondary | ICD-10-CM | POA: Diagnosis not present

## 2016-08-07 DIAGNOSIS — I2510 Atherosclerotic heart disease of native coronary artery without angina pectoris: Secondary | ICD-10-CM | POA: Diagnosis not present

## 2016-08-07 DIAGNOSIS — Z6824 Body mass index (BMI) 24.0-24.9, adult: Secondary | ICD-10-CM | POA: Diagnosis not present

## 2016-08-07 DIAGNOSIS — R001 Bradycardia, unspecified: Secondary | ICD-10-CM | POA: Diagnosis not present

## 2016-08-07 DIAGNOSIS — I10 Essential (primary) hypertension: Secondary | ICD-10-CM | POA: Diagnosis not present

## 2016-08-08 DIAGNOSIS — Z8249 Family history of ischemic heart disease and other diseases of the circulatory system: Secondary | ICD-10-CM | POA: Diagnosis not present

## 2016-08-08 DIAGNOSIS — I2510 Atherosclerotic heart disease of native coronary artery without angina pectoris: Secondary | ICD-10-CM | POA: Diagnosis not present

## 2016-08-08 DIAGNOSIS — I440 Atrioventricular block, first degree: Secondary | ICD-10-CM | POA: Diagnosis not present

## 2016-08-08 DIAGNOSIS — I119 Hypertensive heart disease without heart failure: Secondary | ICD-10-CM | POA: Diagnosis not present

## 2016-08-08 DIAGNOSIS — I10 Essential (primary) hypertension: Secondary | ICD-10-CM | POA: Diagnosis not present

## 2016-08-08 DIAGNOSIS — Z6824 Body mass index (BMI) 24.0-24.9, adult: Secondary | ICD-10-CM | POA: Diagnosis not present

## 2016-08-08 DIAGNOSIS — E785 Hyperlipidemia, unspecified: Secondary | ICD-10-CM | POA: Diagnosis not present

## 2016-08-08 DIAGNOSIS — R001 Bradycardia, unspecified: Secondary | ICD-10-CM | POA: Diagnosis not present

## 2016-08-08 DIAGNOSIS — Z951 Presence of aortocoronary bypass graft: Secondary | ICD-10-CM | POA: Diagnosis not present

## 2016-08-08 DIAGNOSIS — I6521 Occlusion and stenosis of right carotid artery: Secondary | ICD-10-CM | POA: Diagnosis not present

## 2016-08-08 DIAGNOSIS — E784 Other hyperlipidemia: Secondary | ICD-10-CM | POA: Diagnosis not present

## 2016-08-08 DIAGNOSIS — I25110 Atherosclerotic heart disease of native coronary artery with unstable angina pectoris: Secondary | ICD-10-CM | POA: Diagnosis not present

## 2016-08-08 DIAGNOSIS — R0602 Shortness of breath: Secondary | ICD-10-CM | POA: Diagnosis not present

## 2016-08-09 DIAGNOSIS — R5383 Other fatigue: Secondary | ICD-10-CM | POA: Diagnosis present

## 2016-08-09 DIAGNOSIS — Z6825 Body mass index (BMI) 25.0-25.9, adult: Secondary | ICD-10-CM | POA: Diagnosis not present

## 2016-08-09 DIAGNOSIS — I10 Essential (primary) hypertension: Secondary | ICD-10-CM | POA: Diagnosis not present

## 2016-08-09 DIAGNOSIS — Z8673 Personal history of transient ischemic attack (TIA), and cerebral infarction without residual deficits: Secondary | ICD-10-CM | POA: Diagnosis not present

## 2016-08-09 DIAGNOSIS — K5090 Crohn's disease, unspecified, without complications: Secondary | ICD-10-CM | POA: Diagnosis present

## 2016-08-09 DIAGNOSIS — I2582 Chronic total occlusion of coronary artery: Secondary | ICD-10-CM | POA: Diagnosis not present

## 2016-08-09 DIAGNOSIS — I25119 Atherosclerotic heart disease of native coronary artery with unspecified angina pectoris: Secondary | ICD-10-CM | POA: Diagnosis present

## 2016-08-09 DIAGNOSIS — Z7982 Long term (current) use of aspirin: Secondary | ICD-10-CM | POA: Diagnosis not present

## 2016-08-09 DIAGNOSIS — R001 Bradycardia, unspecified: Secondary | ICD-10-CM | POA: Diagnosis present

## 2016-08-09 DIAGNOSIS — K219 Gastro-esophageal reflux disease without esophagitis: Secondary | ICD-10-CM | POA: Diagnosis present

## 2016-08-09 DIAGNOSIS — Z951 Presence of aortocoronary bypass graft: Secondary | ICD-10-CM | POA: Diagnosis not present

## 2016-08-09 DIAGNOSIS — I2510 Atherosclerotic heart disease of native coronary artery without angina pectoris: Secondary | ICD-10-CM | POA: Diagnosis not present

## 2016-08-09 DIAGNOSIS — Z66 Do not resuscitate: Secondary | ICD-10-CM | POA: Diagnosis present

## 2016-08-09 DIAGNOSIS — E785 Hyperlipidemia, unspecified: Secondary | ICD-10-CM | POA: Diagnosis not present

## 2016-08-09 DIAGNOSIS — I25708 Atherosclerosis of coronary artery bypass graft(s), unspecified, with other forms of angina pectoris: Secondary | ICD-10-CM | POA: Diagnosis not present

## 2016-08-09 DIAGNOSIS — Z6824 Body mass index (BMI) 24.0-24.9, adult: Secondary | ICD-10-CM | POA: Diagnosis not present

## 2016-08-09 DIAGNOSIS — I440 Atrioventricular block, first degree: Secondary | ICD-10-CM | POA: Diagnosis not present

## 2016-08-09 DIAGNOSIS — I25118 Atherosclerotic heart disease of native coronary artery with other forms of angina pectoris: Secondary | ICD-10-CM | POA: Diagnosis not present

## 2016-08-09 DIAGNOSIS — R0602 Shortness of breath: Secondary | ICD-10-CM | POA: Diagnosis not present

## 2016-08-16 DIAGNOSIS — Z6825 Body mass index (BMI) 25.0-25.9, adult: Secondary | ICD-10-CM | POA: Diagnosis not present

## 2016-08-16 DIAGNOSIS — Z9181 History of falling: Secondary | ICD-10-CM | POA: Diagnosis not present

## 2016-08-16 DIAGNOSIS — Z79899 Other long term (current) drug therapy: Secondary | ICD-10-CM | POA: Diagnosis not present

## 2016-08-16 DIAGNOSIS — I10 Essential (primary) hypertension: Secondary | ICD-10-CM | POA: Diagnosis not present

## 2016-08-16 DIAGNOSIS — E785 Hyperlipidemia, unspecified: Secondary | ICD-10-CM | POA: Diagnosis not present

## 2016-08-16 DIAGNOSIS — I2510 Atherosclerotic heart disease of native coronary artery without angina pectoris: Secondary | ICD-10-CM | POA: Diagnosis not present

## 2016-08-19 DIAGNOSIS — Z125 Encounter for screening for malignant neoplasm of prostate: Secondary | ICD-10-CM | POA: Diagnosis not present

## 2016-08-19 DIAGNOSIS — E785 Hyperlipidemia, unspecified: Secondary | ICD-10-CM | POA: Diagnosis not present

## 2016-09-06 DIAGNOSIS — I10 Essential (primary) hypertension: Secondary | ICD-10-CM | POA: Diagnosis not present

## 2016-09-06 DIAGNOSIS — Z6825 Body mass index (BMI) 25.0-25.9, adult: Secondary | ICD-10-CM | POA: Diagnosis not present

## 2016-09-06 DIAGNOSIS — I2510 Atherosclerotic heart disease of native coronary artery without angina pectoris: Secondary | ICD-10-CM | POA: Diagnosis not present

## 2016-09-06 DIAGNOSIS — E785 Hyperlipidemia, unspecified: Secondary | ICD-10-CM | POA: Diagnosis not present

## 2016-09-13 DIAGNOSIS — Z955 Presence of coronary angioplasty implant and graft: Secondary | ICD-10-CM | POA: Diagnosis not present

## 2016-09-14 DIAGNOSIS — Z955 Presence of coronary angioplasty implant and graft: Secondary | ICD-10-CM | POA: Diagnosis not present

## 2016-09-16 DIAGNOSIS — Z955 Presence of coronary angioplasty implant and graft: Secondary | ICD-10-CM | POA: Diagnosis not present

## 2016-09-19 DIAGNOSIS — Z955 Presence of coronary angioplasty implant and graft: Secondary | ICD-10-CM | POA: Diagnosis not present

## 2016-09-21 DIAGNOSIS — Z955 Presence of coronary angioplasty implant and graft: Secondary | ICD-10-CM | POA: Diagnosis not present

## 2016-09-22 DIAGNOSIS — Z955 Presence of coronary angioplasty implant and graft: Secondary | ICD-10-CM | POA: Diagnosis not present

## 2016-09-26 DIAGNOSIS — Z955 Presence of coronary angioplasty implant and graft: Secondary | ICD-10-CM | POA: Diagnosis not present

## 2016-09-28 DIAGNOSIS — Z955 Presence of coronary angioplasty implant and graft: Secondary | ICD-10-CM | POA: Diagnosis not present

## 2016-10-03 DIAGNOSIS — Z955 Presence of coronary angioplasty implant and graft: Secondary | ICD-10-CM | POA: Diagnosis not present

## 2016-10-03 DIAGNOSIS — R05 Cough: Secondary | ICD-10-CM | POA: Diagnosis not present

## 2016-10-03 DIAGNOSIS — Z6826 Body mass index (BMI) 26.0-26.9, adult: Secondary | ICD-10-CM | POA: Diagnosis not present

## 2016-10-05 DIAGNOSIS — Z955 Presence of coronary angioplasty implant and graft: Secondary | ICD-10-CM | POA: Diagnosis not present

## 2016-10-07 DIAGNOSIS — Z955 Presence of coronary angioplasty implant and graft: Secondary | ICD-10-CM | POA: Diagnosis not present

## 2016-10-13 DIAGNOSIS — Z6826 Body mass index (BMI) 26.0-26.9, adult: Secondary | ICD-10-CM | POA: Diagnosis not present

## 2016-10-13 DIAGNOSIS — R05 Cough: Secondary | ICD-10-CM | POA: Diagnosis not present

## 2016-10-14 DIAGNOSIS — Z955 Presence of coronary angioplasty implant and graft: Secondary | ICD-10-CM | POA: Diagnosis not present

## 2016-10-17 DIAGNOSIS — Z955 Presence of coronary angioplasty implant and graft: Secondary | ICD-10-CM | POA: Diagnosis not present

## 2016-10-19 DIAGNOSIS — Z955 Presence of coronary angioplasty implant and graft: Secondary | ICD-10-CM | POA: Diagnosis not present

## 2016-10-21 DIAGNOSIS — Z955 Presence of coronary angioplasty implant and graft: Secondary | ICD-10-CM | POA: Diagnosis not present

## 2016-10-24 DIAGNOSIS — Z955 Presence of coronary angioplasty implant and graft: Secondary | ICD-10-CM | POA: Diagnosis not present

## 2016-10-26 DIAGNOSIS — Z955 Presence of coronary angioplasty implant and graft: Secondary | ICD-10-CM | POA: Diagnosis not present

## 2016-10-28 DIAGNOSIS — Z955 Presence of coronary angioplasty implant and graft: Secondary | ICD-10-CM | POA: Diagnosis not present

## 2016-10-31 DIAGNOSIS — Z955 Presence of coronary angioplasty implant and graft: Secondary | ICD-10-CM | POA: Diagnosis not present

## 2016-11-07 DIAGNOSIS — Z951 Presence of aortocoronary bypass graft: Secondary | ICD-10-CM | POA: Diagnosis not present

## 2016-11-07 DIAGNOSIS — Z955 Presence of coronary angioplasty implant and graft: Secondary | ICD-10-CM | POA: Diagnosis not present

## 2016-11-09 DIAGNOSIS — Z955 Presence of coronary angioplasty implant and graft: Secondary | ICD-10-CM | POA: Diagnosis not present

## 2016-11-09 DIAGNOSIS — Z951 Presence of aortocoronary bypass graft: Secondary | ICD-10-CM | POA: Diagnosis not present

## 2016-11-11 DIAGNOSIS — Z951 Presence of aortocoronary bypass graft: Secondary | ICD-10-CM | POA: Diagnosis not present

## 2016-11-11 DIAGNOSIS — Z955 Presence of coronary angioplasty implant and graft: Secondary | ICD-10-CM | POA: Diagnosis not present

## 2016-11-14 DIAGNOSIS — Z955 Presence of coronary angioplasty implant and graft: Secondary | ICD-10-CM | POA: Diagnosis not present

## 2016-11-14 DIAGNOSIS — Z951 Presence of aortocoronary bypass graft: Secondary | ICD-10-CM | POA: Diagnosis not present

## 2016-11-16 DIAGNOSIS — Z951 Presence of aortocoronary bypass graft: Secondary | ICD-10-CM | POA: Diagnosis not present

## 2016-11-16 DIAGNOSIS — Z955 Presence of coronary angioplasty implant and graft: Secondary | ICD-10-CM | POA: Diagnosis not present

## 2016-11-18 DIAGNOSIS — Z951 Presence of aortocoronary bypass graft: Secondary | ICD-10-CM | POA: Diagnosis not present

## 2016-11-18 DIAGNOSIS — Z955 Presence of coronary angioplasty implant and graft: Secondary | ICD-10-CM | POA: Diagnosis not present

## 2016-11-21 DIAGNOSIS — Z951 Presence of aortocoronary bypass graft: Secondary | ICD-10-CM | POA: Diagnosis not present

## 2016-11-21 DIAGNOSIS — Z955 Presence of coronary angioplasty implant and graft: Secondary | ICD-10-CM | POA: Diagnosis not present

## 2016-11-23 DIAGNOSIS — Z955 Presence of coronary angioplasty implant and graft: Secondary | ICD-10-CM | POA: Diagnosis not present

## 2016-11-23 DIAGNOSIS — Z951 Presence of aortocoronary bypass graft: Secondary | ICD-10-CM | POA: Diagnosis not present

## 2016-11-25 DIAGNOSIS — Z955 Presence of coronary angioplasty implant and graft: Secondary | ICD-10-CM | POA: Diagnosis not present

## 2016-11-25 DIAGNOSIS — Z951 Presence of aortocoronary bypass graft: Secondary | ICD-10-CM | POA: Diagnosis not present

## 2016-11-30 DIAGNOSIS — Z955 Presence of coronary angioplasty implant and graft: Secondary | ICD-10-CM | POA: Diagnosis not present

## 2016-11-30 DIAGNOSIS — Z951 Presence of aortocoronary bypass graft: Secondary | ICD-10-CM | POA: Diagnosis not present

## 2016-12-01 DIAGNOSIS — Z951 Presence of aortocoronary bypass graft: Secondary | ICD-10-CM | POA: Diagnosis not present

## 2016-12-01 DIAGNOSIS — Z955 Presence of coronary angioplasty implant and graft: Secondary | ICD-10-CM | POA: Diagnosis not present

## 2016-12-02 DIAGNOSIS — Z955 Presence of coronary angioplasty implant and graft: Secondary | ICD-10-CM | POA: Diagnosis not present

## 2016-12-05 DIAGNOSIS — Z955 Presence of coronary angioplasty implant and graft: Secondary | ICD-10-CM | POA: Diagnosis not present

## 2016-12-07 DIAGNOSIS — Z955 Presence of coronary angioplasty implant and graft: Secondary | ICD-10-CM | POA: Diagnosis not present

## 2016-12-09 DIAGNOSIS — Z955 Presence of coronary angioplasty implant and graft: Secondary | ICD-10-CM | POA: Diagnosis not present

## 2016-12-12 DIAGNOSIS — Z955 Presence of coronary angioplasty implant and graft: Secondary | ICD-10-CM | POA: Diagnosis not present

## 2016-12-14 DIAGNOSIS — Z955 Presence of coronary angioplasty implant and graft: Secondary | ICD-10-CM | POA: Diagnosis not present

## 2016-12-20 DIAGNOSIS — Z0000 Encounter for general adult medical examination without abnormal findings: Secondary | ICD-10-CM | POA: Diagnosis not present

## 2016-12-20 DIAGNOSIS — Z9181 History of falling: Secondary | ICD-10-CM | POA: Diagnosis not present

## 2016-12-20 DIAGNOSIS — Z136 Encounter for screening for cardiovascular disorders: Secondary | ICD-10-CM | POA: Diagnosis not present

## 2016-12-20 DIAGNOSIS — E785 Hyperlipidemia, unspecified: Secondary | ICD-10-CM | POA: Diagnosis not present

## 2016-12-20 DIAGNOSIS — Z1389 Encounter for screening for other disorder: Secondary | ICD-10-CM | POA: Diagnosis not present

## 2016-12-20 DIAGNOSIS — Z125 Encounter for screening for malignant neoplasm of prostate: Secondary | ICD-10-CM | POA: Diagnosis not present

## 2017-01-04 ENCOUNTER — Encounter: Payer: Self-pay | Admitting: Cardiology

## 2017-01-05 ENCOUNTER — Ambulatory Visit (INDEPENDENT_AMBULATORY_CARE_PROVIDER_SITE_OTHER): Payer: Medicare Other | Admitting: Cardiology

## 2017-01-05 ENCOUNTER — Encounter: Payer: Self-pay | Admitting: Cardiology

## 2017-01-05 VITALS — BP 134/74 | HR 60 | Ht 66.0 in | Wt 161.8 lb

## 2017-01-05 DIAGNOSIS — I25119 Atherosclerotic heart disease of native coronary artery with unspecified angina pectoris: Secondary | ICD-10-CM

## 2017-01-05 DIAGNOSIS — I10 Essential (primary) hypertension: Secondary | ICD-10-CM

## 2017-01-05 DIAGNOSIS — E785 Hyperlipidemia, unspecified: Secondary | ICD-10-CM

## 2017-01-05 NOTE — Patient Instructions (Addendum)
Medication Instructions:  Your physician recommends that you continue on your current medications as directed. Please refer to the Current Medication list given to you today.   Labwork: None  Testing/Procedures: You had an EKG in the office today.  Follow-Up: Your physician wants you to follow-up in: 6 months. You will receive a reminder letter in the mail two months in advance. If you don't receive a letter, please call our office to schedule the follow-up appointment.    Any Other Special Instructions Will Be Listed Below (If Applicable).     If you need a refill on your cardiac medications before your next appointment, please call your pharmacy.    Coronary Artery Disease, Male Coronary artery disease (CAD) is a condition in which the arteries that lead to the heart (coronary arteries) become narrow or blocked. The narrowing or blockage can lead to decreased blood flow to the heart. Prolonged reduced blood flow can cause a heart attack (myocardial infarction or MI). This condition may also be called coronary heart disease. Because CAD is the leading cause of death in men, it is important to understand what causes this condition and how it is treated. What are the causes? CAD is most often caused by atherosclerosis. This is the buildup of fat and cholesterol (plaque) on the inside of the arteries. Over time, the plaque may narrow or block the artery, reducing blood flow to the heart. Plaque can also become weak and break off within a coronary artery and cause a sudden blockage. Other less common causes of CAD include:  An embolism or blood clot in a coronary artery.  A tearing of the artery (spontaneous coronary artery dissection).  An aneurysm.  Inflammation (vasculitis) in the artery wall.  What increases the risk? The following factors may make you more likely to develop this condition:  Age. Men over age 63 are at a greater risk of CAD.  Family history of  CAD.  Gender. Men often develop CAD earlier in life than women.  High blood pressure (hypertension).  Diabetes.  High cholesterol levels.  Tobacco use.  Excessive alcohol use.  Lack of exercise.  A diet high in saturated and trans fats, such as fried food and processed meat.  Other possible risk factors include:  High stress levels.  Depression.  Obesity.  Sleep apnea.  What are the signs or symptoms? Many people do not have any symptoms during the early stages of CAD. As the condition progresses, symptoms may include:  Chest pain (angina). The pain can: ? Feel like a crushing or squeezing, or a tightness, pressure, fullness, or heaviness in the chest. ? Last more than a few minutes or can stop and recur. The pain tends to get worse with exercise or stress and to fade with rest.  Pain in the arms, neck, jaw, or back.  Unexplained heartburn or indigestion.  Shortness of breath.  Nausea or vomiting.  Sudden light-headedness.  Sudden cold sweats.  Fluttering or fast heartbeat (palpitations).  How is this diagnosed? This condition is diagnosed based on:  Your family and medical history.  A physical exam.  Tests, including: ? A test to check the electrical signals in your heart (electrocardiogram). ? Exercise stress test. This looks for signs of blockage when the heart is stressed with exercise, such as running on a treadmill. ? Pharmacologic stress test. This test looks for signs of blockage when the heart is being stressed with a medicine. ? Blood tests. ? Coronary angiogram. This is a  procedure to look at the coronary arteries to see if there is any blockage. During this test, a dye is injected into your arteries so they appear on an X-ray. ? A test that uses sound waves to take a picture of your heart (echocardiogram). ? Chest X-ray.  How is this treated? This condition may be treated by:  Healthy lifestyle changes to reduce risk  factors.  Medicines such as: ? Antiplatelet medicines and blood-thinning medicines, such as aspirin. These help to prevent blood clots. ? Nitroglycerin. ? Blood pressure medicines. ? Cholesterol-lowering medicine.  Coronary angioplasty and stenting. During this procedure, a thin, flexible tube is inserted through a blood vessel and into a blocked artery. A balloon or similar device on the end of the tube is inflated to open up the artery. In some cases, a small, mesh tube (stent) is inserted into the artery to keep it open.  Coronary artery bypass surgery. During this surgery, veins or arteries from other parts of the body are used to create a bypass around the blockage and allow blood to reach your heart.  Follow these instructions at home: Medicines  Take over-the-counter and prescription medicines only as told by your health care provider.  Do not take the following medicines unless your health care provider approves: ? NSAIDs, such as ibuprofen, naproxen, or celecoxib. ? Vitamin supplements that contain vitamin A, vitamin E, or both. Lifestyle  Follow an exercise program approved by your health care provider. Aim for 150 minutes of moderate exercise or 75 minutes of vigorous exercise each week.  Maintain a healthy weight or lose weight as approved by your health care provider.  Rest when you are tired.  Learn to manage stress or try to limit your stress. Ask your health care provider for suggestions if you need help.  Get screened for depression and seek treatment, if needed.  Do not use any products that contain nicotine or tobacco, such as cigarettes and e-cigarettes. If you need help quitting, ask your health care provider.  Do not use illegal drugs. Eating and drinking  Follow a heart-healthy diet. A dietitian can help educate you about healthy food options and changes. In general, eat plenty of fruits and vegetables, lean meats, and whole grains.  Avoid foods high  in: ? Sugar. ? Salt (sodium). ? Saturated fat, such as processed or fatty meat. ? Trans fat, such as fried foods.  Use healthy cooking methods such as roasting, grilling, broiling, baking, poaching, steaming, or stir-frying.  If you drink alcohol, and your health care provider approves, limit your alcohol intake to no more than 2 drinks per day. One drink equals 12 ounces of beer, 5 ounces of wine, or 1 ounces of hard liquor. General instructions  Manage any other health conditions, such as hypertension and diabetes. These conditions affect your heart.  Your health care provider may ask you to monitor your blood pressure. Ideally, your blood pressure should be below 130/80.  Keep all follow-up visits as told by your health care provider. This is important. Get help right away if:  You have pain in your chest, neck, arm, jaw, stomach, or back that: ? Lasts more than a few minutes. ? Is recurring. ? Is not relieved by taking medicine under your tongue (sublingualnitroglycerin).  You have too much (profuse) sweating without cause.  You have unexplained: ? Heartburn or indigestion. ? Shortness of breath or difficulty breathing. ? Fluttering or fast heartbeat (palpitations). ? Nausea or vomiting. ? Fatigue. ? Feelings of  nervousness or anxiety. ? Weakness. ? Diarrhea.  You have sudden light-headedness or dizziness.  You faint.  You feel like hurting yourself or think about taking your own life. These symptoms may represent a serious problem that is an emergency. Do not wait to see if the symptoms will go away. Get medical help right away. Call your local emergency services (911 in the [place] Do not drive yourself to the hospital. Summary  Coronary artery disease (CAD) is a process in which the arteries that lead to the heart (coronary arteries) become narrow or blocked. The narrowing or blockage can lead to a heart attack.  Many people do not have any symptoms during the  early stages of CAD. This is called "silent CAD."  CAD can be treated with lifestyle changes, medicines, surgery, or a combination of these treatments. This information is not intended to replace advice given to you by your health care provider. Make sure you discuss any questions you have with your health care provider. Document Released: 01/15/2014 Document Revised: 06/10/2016 Document Reviewed: 06/10/2016 Elsevier Interactive Patient Education  2017 [employer].

## 2017-01-05 NOTE — Progress Notes (Signed)
Cardiology Office Note:    Date:  01/05/2017   ID:  James Armstrong, DOB 1941-03-16, MRN [id number]  PCP:  Paulina Fusi, MD  Cardiologist:  Norman Herrlich, MD    Referring MD: No ref. provider found    ASSESSMENT:    1. Coronary artery disease involving native coronary artery of native heart with angina pectoris (HCC)   2. Essential hypertension   3. Hyperlipidemia, unspecified hyperlipidemia type    PLAN:    In order of problems listed above:  1. Stable continue current treatment including dual antiplatelet beta blocker and on intensity statin. I encouraged him to continue his current lifestyle modifications with diet and exercise. 2. Stable blood pressure target continue current treatment 3. Stable continue current lipid lowering treatment including high intensity statin.  Next appointment: 6 months   Medication Adjustments/Labs and Tests Ordered: Current medicines are reviewed at length with the patient today.  Concerns regarding medicines are outlined above.  No orders of the defined types were placed in this encounter.  No orders of the defined types were placed in this encounter.   Chief Complaint  Patient presents with  . Follow-up    for CAD    History of Present Illness:    James Armstrong is a 76 y.o. male with a hx of CAD with PCI / Resolute Drug Eluting Stent of the distal Left Main/Ostial LCF 08/09/16 for troponin normal unstable angina, Dyslipidemia, HTN, S/P CABG in 2003, CVA, with CEA and a small abdominal aneurysm .He is finishing his cardiac rehabilitation program following a very strict diet with his wife's new onset diabetes and has purposely lost 10 pounds. He is pleased with the quality of his life having no angina dyspnea palpitations syncope or TIA.  Compliance with diet, lifestyle and medications: yes Past Medical History:  Diagnosis Date  . Abdominal aortic aneurysm (AAA) 30 to 34 mm in diameter (HCC)   . Asthma   . Crohn disease (HCC)    . GERD (gastroesophageal reflux disease)   . Stroke [facility])     Past Surgical History:  Procedure Laterality Date  . CAROTID ENDARTERECTOMY    . CHOLECYSTECTOMY    . CORONARY ARTERY BYPASS GRAFT     2004  . HERNIA REPAIR      Current Medications: Current Meds  Medication Sig  . aspirin EC 81 MG tablet Take 81 mg by mouth daily.  . clopidogrel (PLAVIX) 75 MG tablet Take 75 mg by mouth daily.  . fenofibrate 160 MG tablet Take 160 mg by mouth daily.  . metoprolol tartrate (LOPRESSOR) 50 MG tablet Take 25 mg by mouth 2 (two) times daily. Takes 0.5 tablet BID   . omeprazole (PRILOSEC) 20 MG capsule Take 20 mg by mouth daily.  . rosuvastatin (CRESTOR) 40 MG tablet Take 40 mg by mouth daily.     Allergies:   Patient has no known allergies.   Social History   Social History  . Marital status: Single    Spouse name: N/A  . Number of children: N/A  . Years of education: N/A   Social History Main Topics  . Smoking status: Never Smoker  . Smokeless tobacco: Never Used  . Alcohol use None  . Drug use: Unknown  . Sexual activity: Not Asked   Other Topics Concern  . None   Social History Narrative  . None     Family History: The patient's family history includes Diabetes in his brother; Heart attack in his brother;  Stroke in his mother. ROS:   Please see the history of present illness.    All other systems reviewed and are negative.  EKGs/Labs/Other Studies Reviewed:    The following studies were reviewed today:  EKG:  EKG ordered today.  The ekg ordered today demonstrates SRTH 57 BPM otherwise normal Recent labs requested from his PCP  Physical Exam:    VS:  BP 134/74   Pulse 60   Ht 5\' 6"  (1.676 m)   Wt 161 lb 12.8 oz (73.4 kg)   SpO2 97%   BMI 26.12 kg/m     Wt Readings from Last 3 Encounters:  01/05/17 161 lb 12.8 oz (73.4 kg)     GEN:  Well nourished, well developed in no acute distress HEENT: Normal NECK: No JVD; No carotid bruits LYMPHATICS: No  lymphadenopathy CARDIAC: RRR, no murmurs, rubs, gallops RESPIRATORY:  Clear to auscultation without rales, wheezing or rhonchi  ABDOMEN: Soft, non-tender, non-distended MUSCULOSKELETAL:  No edema; No deformity  SKIN: Warm and dry NEUROLOGIC:  Alert and oriented x 3 PSYCHIATRIC:  Normal affect    Signed, Norman Herrlich, MD  01/05/2017 8:34 AM    [facility]

## 2017-02-01 DIAGNOSIS — L821 Other seborrheic keratosis: Secondary | ICD-10-CM | POA: Diagnosis not present

## 2017-02-01 DIAGNOSIS — L578 Other skin changes due to chronic exposure to nonionizing radiation: Secondary | ICD-10-CM | POA: Diagnosis not present

## 2017-02-15 DIAGNOSIS — E785 Hyperlipidemia, unspecified: Secondary | ICD-10-CM | POA: Diagnosis not present

## 2017-02-15 DIAGNOSIS — I2510 Atherosclerotic heart disease of native coronary artery without angina pectoris: Secondary | ICD-10-CM | POA: Diagnosis not present

## 2017-02-15 DIAGNOSIS — Z6826 Body mass index (BMI) 26.0-26.9, adult: Secondary | ICD-10-CM | POA: Diagnosis not present

## 2017-02-15 DIAGNOSIS — Z79899 Other long term (current) drug therapy: Secondary | ICD-10-CM | POA: Diagnosis not present

## 2017-02-15 DIAGNOSIS — I10 Essential (primary) hypertension: Secondary | ICD-10-CM | POA: Diagnosis not present

## 2017-02-22 DIAGNOSIS — I2510 Atherosclerotic heart disease of native coronary artery without angina pectoris: Secondary | ICD-10-CM | POA: Diagnosis not present

## 2017-02-22 DIAGNOSIS — Z79899 Other long term (current) drug therapy: Secondary | ICD-10-CM | POA: Diagnosis not present

## 2017-02-22 DIAGNOSIS — Z8673 Personal history of transient ischemic attack (TIA), and cerebral infarction without residual deficits: Secondary | ICD-10-CM | POA: Diagnosis not present

## 2017-02-22 DIAGNOSIS — I10 Essential (primary) hypertension: Secondary | ICD-10-CM | POA: Diagnosis not present

## 2017-02-22 DIAGNOSIS — R0789 Other chest pain: Secondary | ICD-10-CM | POA: Diagnosis not present

## 2017-02-22 DIAGNOSIS — E785 Hyperlipidemia, unspecified: Secondary | ICD-10-CM | POA: Diagnosis not present

## 2017-02-22 DIAGNOSIS — Z6826 Body mass index (BMI) 26.0-26.9, adult: Secondary | ICD-10-CM | POA: Diagnosis not present

## 2017-02-22 DIAGNOSIS — I080 Rheumatic disorders of both mitral and aortic valves: Secondary | ICD-10-CM | POA: Diagnosis not present

## 2017-02-22 DIAGNOSIS — R079 Chest pain, unspecified: Secondary | ICD-10-CM

## 2017-02-22 DIAGNOSIS — R42 Dizziness and giddiness: Secondary | ICD-10-CM | POA: Diagnosis not present

## 2017-02-22 DIAGNOSIS — R0602 Shortness of breath: Secondary | ICD-10-CM | POA: Diagnosis not present

## 2017-02-22 DIAGNOSIS — Z7982 Long term (current) use of aspirin: Secondary | ICD-10-CM | POA: Diagnosis not present

## 2017-02-22 DIAGNOSIS — K5090 Crohn's disease, unspecified, without complications: Secondary | ICD-10-CM | POA: Diagnosis not present

## 2017-02-22 DIAGNOSIS — K219 Gastro-esophageal reflux disease without esophagitis: Secondary | ICD-10-CM | POA: Diagnosis not present

## 2017-02-22 HISTORY — DX: Chest pain, unspecified: R07.9

## 2017-02-23 ENCOUNTER — Telehealth: Payer: Self-pay | Admitting: Cardiology

## 2017-02-23 DIAGNOSIS — R079 Chest pain, unspecified: Secondary | ICD-10-CM | POA: Diagnosis not present

## 2017-02-23 DIAGNOSIS — I25118 Atherosclerotic heart disease of native coronary artery with other forms of angina pectoris: Secondary | ICD-10-CM | POA: Diagnosis not present

## 2017-02-23 DIAGNOSIS — R001 Bradycardia, unspecified: Secondary | ICD-10-CM | POA: Diagnosis not present

## 2017-02-23 DIAGNOSIS — I10 Essential (primary) hypertension: Secondary | ICD-10-CM | POA: Diagnosis not present

## 2017-02-23 DIAGNOSIS — I25810 Atherosclerosis of coronary artery bypass graft(s) without angina pectoris: Secondary | ICD-10-CM | POA: Diagnosis not present

## 2017-02-23 DIAGNOSIS — I440 Atrioventricular block, first degree: Secondary | ICD-10-CM | POA: Diagnosis not present

## 2017-02-23 DIAGNOSIS — I340 Nonrheumatic mitral (valve) insufficiency: Secondary | ICD-10-CM | POA: Diagnosis not present

## 2017-02-23 DIAGNOSIS — I25708 Atherosclerosis of coronary artery bypass graft(s), unspecified, with other forms of angina pectoris: Secondary | ICD-10-CM | POA: Diagnosis not present

## 2017-02-23 DIAGNOSIS — Z6826 Body mass index (BMI) 26.0-26.9, adult: Secondary | ICD-10-CM | POA: Diagnosis not present

## 2017-02-23 DIAGNOSIS — I371 Nonrheumatic pulmonary valve insufficiency: Secondary | ICD-10-CM | POA: Diagnosis not present

## 2017-02-23 DIAGNOSIS — I361 Nonrheumatic tricuspid (valve) insufficiency: Secondary | ICD-10-CM | POA: Diagnosis not present

## 2017-02-23 DIAGNOSIS — Z955 Presence of coronary angioplasty implant and graft: Secondary | ICD-10-CM | POA: Diagnosis not present

## 2017-02-23 DIAGNOSIS — R0789 Other chest pain: Secondary | ICD-10-CM | POA: Diagnosis not present

## 2017-02-23 DIAGNOSIS — I2582 Chronic total occlusion of coronary artery: Secondary | ICD-10-CM | POA: Diagnosis not present

## 2017-02-23 DIAGNOSIS — E784 Other hyperlipidemia: Secondary | ICD-10-CM | POA: Diagnosis not present

## 2017-02-23 DIAGNOSIS — I351 Nonrheumatic aortic (valve) insufficiency: Secondary | ICD-10-CM | POA: Diagnosis not present

## 2017-02-23 NOTE — Telephone Encounter (Signed)
Spoke with Annabelle Harman. His history is that he had a normal stress test, refused to leave, received a cath and had a stent. Patient is now at [facility] with severe shortness of breath and chest heaviness, EKG and cardiac enzymes normal (same as last time). Annabelle Harman states that they originally ordered a stress test, but have now decided not to do a stress test and proceed with the cath. No further questions or assistance needed from Dr. Dulce Sellar.

## 2017-02-23 NOTE — Telephone Encounter (Signed)
*  Not the patient [name] [phone]  Pt is in [facility] and was told he needed a stress test but they want you to help them avoid that

## 2017-02-24 DIAGNOSIS — E784 Other hyperlipidemia: Secondary | ICD-10-CM | POA: Diagnosis not present

## 2017-02-24 DIAGNOSIS — R079 Chest pain, unspecified: Secondary | ICD-10-CM | POA: Diagnosis not present

## 2017-02-24 DIAGNOSIS — E785 Hyperlipidemia, unspecified: Secondary | ICD-10-CM | POA: Diagnosis not present

## 2017-02-24 DIAGNOSIS — I25810 Atherosclerosis of coronary artery bypass graft(s) without angina pectoris: Secondary | ICD-10-CM | POA: Diagnosis not present

## 2017-02-24 DIAGNOSIS — Z6826 Body mass index (BMI) 26.0-26.9, adult: Secondary | ICD-10-CM | POA: Diagnosis not present

## 2017-02-24 DIAGNOSIS — I10 Essential (primary) hypertension: Secondary | ICD-10-CM | POA: Diagnosis not present

## 2017-02-24 DIAGNOSIS — I2510 Atherosclerotic heart disease of native coronary artery without angina pectoris: Secondary | ICD-10-CM | POA: Diagnosis not present

## 2017-02-27 ENCOUNTER — Telehealth: Payer: Self-pay | Admitting: Cardiology

## 2017-02-27 NOTE — Telephone Encounter (Signed)
Patient advised to continue metoprolol 25 mg twice daily and appointment made for 03/08/17. No further questions.

## 2017-02-27 NOTE — Telephone Encounter (Signed)
Patient states that he was in [facility] and wants to talk about changes they made in his medications by Dr. Sigurd Sos... Please call him

## 2017-02-28 ENCOUNTER — Other Ambulatory Visit: Payer: Self-pay

## 2017-02-28 NOTE — Telephone Encounter (Signed)
Error

## 2017-03-02 DIAGNOSIS — E785 Hyperlipidemia, unspecified: Secondary | ICD-10-CM | POA: Diagnosis not present

## 2017-03-02 DIAGNOSIS — Z79899 Other long term (current) drug therapy: Secondary | ICD-10-CM | POA: Diagnosis not present

## 2017-03-02 DIAGNOSIS — Z6825 Body mass index (BMI) 25.0-25.9, adult: Secondary | ICD-10-CM | POA: Diagnosis not present

## 2017-03-02 DIAGNOSIS — I2510 Atherosclerotic heart disease of native coronary artery without angina pectoris: Secondary | ICD-10-CM | POA: Diagnosis not present

## 2017-03-02 DIAGNOSIS — R079 Chest pain, unspecified: Secondary | ICD-10-CM | POA: Diagnosis not present

## 2017-03-02 DIAGNOSIS — I10 Essential (primary) hypertension: Secondary | ICD-10-CM | POA: Diagnosis not present

## 2017-03-07 NOTE — Progress Notes (Signed)
Cardiology Office Note:    Date:  03/08/2017   ID:  James Armstrong, DOB 1941/01/06, MRN [id number]  PCP:  Paulina Fusi, MD  Cardiologist:  Norman Herrlich, MD    Referring MD: Paulina Fusi, MD    ASSESSMENT:    1. Coronary artery disease involving native coronary artery of native heart with angina pectoris (HCC)   2. Essential hypertension   3. Hyperlipidemia, unspecified hyperlipidemia type    PLAN:    In order of problems listed above:  1.  stable he is having exertional angina and will add ranolazine to improve the quality of his life and continue his current medical treatment with long-term dual antiplatelet beta blocker and high intensity statin. He'll return in one week for EKG to assess QT interval. 2. Stable blood pressure target continue current treatment beta blocker. 3. Stable continue high intensity statin.   Next appointment: 3 months   Medication Adjustments/Labs and Tests Ordered: Current medicines are reviewed at length with the patient today.  Concerns regarding medicines are outlined above.  Orders Placed This Encounter  Procedures  . EKG 12-Lead   Meds ordered this encounter  Medications  . ranolazine (RANEXA) 1000 MG SR tablet    Sig: Take 1 tablet (1,000 mg total) by mouth 2 (two) times daily. Take 1/2 = 500 mg twice daily the first week    Dispense:  60 tablet    Refill:  3    Chief Complaint  Patient presents with  . Follow-up    after LHC at [facility]  . Fatigue    History of Present Illness:    James Armstrong is a 76 y.o. male with a hx of CAD with PCI / Resolute Drug Eluting Stent of the distal Left Main/Ostial LCF 08/09/16 for troponin normal unstable angina, Dyslipidemia, HTN, S/P CABG in 2003, CVA, with CEA and a small abdominal aneurysm  last seen 2 months ago. He had recurrent severe exertional shortness of breath and chest tightness readmitted to [facility] and repeat coronary angiography shows patent stent  of the left main coronary artery.He was felt to be fully revascularized.since then he is somewhat apprehensive and has not returned to his full activities. He's had no recurrent chest pain shortness of breath palpitation syncope or Compliance with diet, lifestyle and medications: yes Past Medical History:  Diagnosis Date  . Abdominal aortic aneurysm (AAA) 30 to 34 mm in diameter (HCC)   . Abdominal aortic aneurysm (HCC) 02/24/2016  . Asthma   . Chest pain 02/22/2017  . Coronary artery disease involving native coronary artery of native heart with angina pectoris (HCC) 02/24/2016   CABG in 2004  coronary angiography done 08/09/16 Abnormal Diagnostic Summary Chronic Total Occlusion of the RCA Severe stenosis of the LM, LAD, Circumflex Patent LIMA graft to the mid LAD. Patent SVG graft to the Ramus coronary artery. Moderate disease of SVG graft to the Posterior descending coronary artery. LV not done due to tortuosity of right subclavian Interventional Summary Successful PCI / Resolute Drug Eluting Stent of the distal Left Main/Ostial Circumflex Coronary Artery.      . Crohn disease (HCC)   . Essential hypertension 02/24/2016  . GERD (gastroesophageal reflux disease)   . Hx of CABG 02/24/2016   Overview:  In 2003  . Hyperlipemia 02/24/2016  . Shortness of breath 08/06/2016  . Sinus bradycardia 08/06/2016  . Stroke [facility])     Past Surgical History:  Procedure Laterality Date  . CARDIAC CATHETERIZATION    .  CAROTID ENDARTERECTOMY    . CHOLECYSTECTOMY    . CORONARY ARTERY BYPASS GRAFT     2004  . HERNIA REPAIR      Current Medications: Current Meds  Medication Sig  . aspirin EC 81 MG tablet Take 81 mg by mouth daily.  . clopidogrel (PLAVIX) 75 MG tablet Take 75 mg by mouth daily.  . fenofibrate 160 MG tablet Take 160 mg by mouth daily.  . metoprolol tartrate (LOPRESSOR) 50 MG tablet Take 25 mg by mouth 2 (two) times daily.  Marland Kitchen omeprazole (PRILOSEC) 20 MG capsule Take 40 mg by mouth at bedtime.  .  rosuvastatin (CRESTOR) 40 MG tablet Take 40 mg by mouth daily.     Allergies:   Patient has no known allergies.   Social History   Social History  . Marital status: Single    Spouse name: N/A  . Number of children: N/A  . Years of education: N/A   Social History Main Topics  . Smoking status: Never Smoker  . Smokeless tobacco: Never Used  . Alcohol use No  . Drug use: No  . Sexual activity: Not Asked   Other Topics Concern  . None   Social History Narrative  . None     Family History: The patient's family history includes Diabetes in his brother; Heart attack in his brother; Stroke in his mother. ROS:   Please see the history of present illness.    All other systems reviewed and are negative.  EKGs/Labs/Other Studies Reviewed:    The following studies were reviewed today:  Left heart catheterization 02/20/17:Conclusions Diagnostic Procedure Summary Multivessel CAD. Severe stenosis of the LAD Chronic Total Occlusion of the RCA, Ramus Moderate stenosis of the LM, Circumflex Patent LIMA graft to the mid LAD. Patent SVG graft to the Ramus coronary artery. Moderate disease of SVG graft to the Posterior descending coronary artery with 50% stenosis 24 mm length  . Normal LV systolic function. LV ejection fraction is 65 % Diagnostic Procedure Recommendations Visually there is little change since the last procedure Recent Labs: No results found for requested labs within last 8760 hours.  Recent Lipid Panel No results found for: CHOL, TRIG, HDL, CHOLHDL, VLDL, LDLCALC, LDLDIRECT  Physical Exam:    VS:  BP (!) 142/76 (BP Location: Right Arm, Patient Position: Sitting)   Pulse (!) 54   Ht 5\' 6"  (1.676 m)   Wt 161 lb 1.9 oz (73.1 kg)   SpO2 98%   BMI 26.01 kg/m     Wt Readings from Last 3 Encounters:  03/08/17 161 lb 1.9 oz (73.1 kg)  01/05/17 161 lb 12.8 oz (73.4 kg)     GEN:  Well nourished, well developed in no acute distress HEENT: Normal NECK: No JVD;  No carotid bruits LYMPHATICS: No lymphadenopathy CARDIAC: RRR, no murmurs, rubs, gallops RESPIRATORY:  Clear to auscultation without rales, wheezing or rhonchi  ABDOMEN: Soft, non-tender, non-distended MUSCULOSKELETAL:  No edema; No deformity  SKIN: Warm and dry NEUROLOGIC:  Alert and oriented x 3 PSYCHIATRIC:  Normal affect    Signed, Norman Herrlich, MD  03/08/2017 8:44 AM     [facility]

## 2017-03-08 ENCOUNTER — Ambulatory Visit (INDEPENDENT_AMBULATORY_CARE_PROVIDER_SITE_OTHER): Payer: Medicare Other | Admitting: Cardiology

## 2017-03-08 ENCOUNTER — Encounter: Payer: Self-pay | Admitting: Cardiology

## 2017-03-08 VITALS — BP 142/76 | HR 54 | Ht 66.0 in | Wt 161.1 lb

## 2017-03-08 DIAGNOSIS — E785 Hyperlipidemia, unspecified: Secondary | ICD-10-CM

## 2017-03-08 DIAGNOSIS — I10 Essential (primary) hypertension: Secondary | ICD-10-CM

## 2017-03-08 DIAGNOSIS — I25119 Atherosclerotic heart disease of native coronary artery with unspecified angina pectoris: Secondary | ICD-10-CM | POA: Diagnosis not present

## 2017-03-08 MED ORDER — RANOLAZINE ER 1000 MG PO TB12: 500.0000 mg | ORAL_TABLET | Freq: Two times a day (BID) | ORAL | 3 refills | Status: DC

## 2017-03-08 NOTE — Patient Instructions (Addendum)
Medication Instructions:  Your physician has recommended you make the following change in your medication:  START ranolazine (Ranexa) 1000 mg. 500 mg twice daily for the first week, then increase to 1000 mg twice daily.   Labwork: None  Testing/Procedures: You need to return for an EKG in 1 week.  Follow-Up: Your physician recommends that you schedule a follow-up appointment in: 3 months.   Any Other Special Instructions Will Be Listed Below (If Applicable).     If you need a refill on your cardiac medications before your next appointment, please call your pharmacy.    Angina Pectoris Angina pectoris, often called angina, is extreme discomfort in the chest, neck, or arm. This is caused by a lack of blood in the middle and thickest layer of the heart wall (myocardium). There are four types of angina:  Stable angina. Stable angina usually occurs in episodes of predictable frequency and duration. It is usually brought on by physical activity, stress, or excitement. Stable angina usually lasts a few minutes and can often be relieved by a medicine that you place under your tongue. This medicine is called sublingual nitroglycerin.  Unstable angina. Unstable angina can occur even when you are doing little or no physical activity. It can even occur while you are sleeping or when you are at rest. It can suddenly increase in severity or frequency. It may not be relieved by sublingual nitroglycerin, and it can last up to 30 minutes.  Microvascular angina. This type of angina is caused by a disorder of tiny blood vessels called arterioles. Microvascular angina is more common in women. The pain may be more severe and last longer than other types of angina pectoris.  Prinzmetal or variant angina. This type of angina pectoris is rare and usually occurs when you are doing little or no physical activity. It especially occurs in the early morning hours.  What are the causes? Atherosclerosis is the  cause of angina. This is the buildup of fat and cholesterol (plaque) on the inside of the arteries. Over time, the plaque may narrow or block the artery, and this will lessen blood flow to the heart. Plaque can also become weak and break off within a coronary artery to form a clot and cause a sudden blockage. What increases the risk? Risk factors common to both men and women include:  High cholesterol levels.  High blood pressure (hypertension).  Tobacco use.  Diabetes.  Family history of angina.  Obesity.  Lack of exercise.  A diet high in saturated fats.  Women are at greater risk for angina if they are:  Over age 21.  Postmenopausal.  What are the signs or symptoms? Many people do not experience any symptoms during the early stages of angina. As the condition progresses, symptoms common to both men and women may include:  Chest pain. ? The pain can be described as a crushing or squeezing in the chest, or a tightness, pressure, fullness, or heaviness in the chest. ? The pain can last more than a few minutes, or it can stop and recur.  Pain in the arms, neck, jaw, or back.  Unexplained heartburn or indigestion.  Shortness of breath.  Nausea.  Sudden cold sweats.  Sudden light-headedness.  Many women have chest discomfort and some of the other symptoms. However, women often have different (atypical) symptoms, such as:  Fatigue.  Unexplained feelings of nervousness or anxiety.  Unexplained weakness.  Dizziness or fainting.  Sometimes, women may have angina without any symptoms.  How is this diagnosed? Tests to diagnose angina may include:  ECG (electrocardiogram).  Exercise stress test. This looks for signs of blockage when the heart is being exercised.  Pharmacologic stress test. This test looks for signs of blockage when the heart is being stressed with a medicine.  Blood tests.  Coronary angiogram. This is a procedure to look at the coronary  arteries to see if there is any blockage.  How is this treated? The treatment of angina may include the following:  Healthy behavioral changes to reduce or control risk factors.  Medicine.  Coronary stenting.A stent helps to keep an artery open.  Coronary angioplasty. This procedure widens a narrowed or blocked artery.  Coronary arterybypass surgery. This will allow your blood to pass the blockage (bypass) to reach your heart.  Follow these instructions at home:  Take medicines only as directed by your health care provider.  Do not take the following medicines unless your health care provider approves: ? Nonsteroidal anti-inflammatory drugs (NSAIDs), such as ibuprofen, naproxen, or celecoxib. ? Vitamin supplements that contain vitamin A, vitamin E, or both. ? Hormone replacement therapy that contains estrogen with or without progestin.  Manage other health conditions such as hypertension and diabetes as directed by your health care provider.  Follow a heart-healthy diet. A dietitian can help to educate you about healthy food options and changes.  Use healthy cooking methods such as roasting, grilling, broiling, baking, poaching, steaming, or stir-frying. Talk to a dietitian to learn more about healthy cooking methods.  Follow an exercise program approved by your health care provider.  Maintain a healthy weight. Lose weight as approved by your health care provider.  Plan rest periods when fatigued.  Learn to manage stress.  Do not use any tobacco products, including cigarettes, chewing tobacco, or electronic cigarettes. If you need help quitting, ask your health care provider.  If you drink alcohol, and your health care provider approves, limit your alcohol intake to no more than 1 drink per day. One drink equals 12 ounces of beer, 5 ounces of wine, or 1 ounces of hard liquor.  Stop illegal drug use.  Keep all follow-up visits as directed by your health care provider.  This is important. Get help right away if:  You have pain in your chest, neck, arm, jaw, stomach, or back that lasts more than a few minutes, is recurring, or is unrelieved by taking sublingualnitroglycerin.  You have profuse sweating without cause.  You have unexplained: ? Heartburn or indigestion. ? Shortness of breath or difficulty breathing. ? Nausea or vomiting. ? Fatigue. ? Feelings of nervousness or anxiety. ? Weakness. ? Diarrhea.  You have sudden light-headedness or dizziness.  You faint. These symptoms may represent a serious problem that is an emergency. Do not wait to see if the symptoms will go away. Get medical help right away. Call your local emergency services (911 in the [place] Do not drive yourself to the hospital. This information is not intended to replace advice given to you by your health care provider. Make sure you discuss any questions you have with your health care provider. Document Released: 06/20/2005 Document Revised: 12/02/2015 Document Reviewed: 10/22/2013 Elsevier Interactive Patient Education  2017 [employer].

## 2017-03-10 ENCOUNTER — Telehealth: Payer: Self-pay | Admitting: Cardiology

## 2017-03-10 NOTE — Telephone Encounter (Signed)
Went to pick up Ranexa and it was $334-wants something cheaper

## 2017-03-10 NOTE — Telephone Encounter (Signed)
Patient will come to pick up samples of Ranexa this afternoon if he can, if not it will be Monday. Will fill out Ranexa [employer] paperwork as well.

## 2017-03-13 ENCOUNTER — Other Ambulatory Visit: Payer: Self-pay

## 2017-03-13 DIAGNOSIS — I25119 Atherosclerotic heart disease of native coronary artery with unspecified angina pectoris: Secondary | ICD-10-CM

## 2017-03-13 MED ORDER — RANOLAZINE ER 1000 MG PO TB12: 1000.0000 mg | ORAL_TABLET | Freq: Two times a day (BID) | ORAL | 11 refills | Status: DC

## 2017-03-13 NOTE — Telephone Encounter (Signed)
Printing prescription for Ranexa for patient to enroll in [employer]. Patient coming in for samples and will Take 1/2 = 500 mg twice daily the first week. Then begin 1,000 mg twice daily.

## 2017-03-15 ENCOUNTER — Telehealth: Payer: Self-pay | Admitting: Cardiology

## 2017-03-15 ENCOUNTER — Ambulatory Visit: Payer: Medicare Other

## 2017-03-15 NOTE — Telephone Encounter (Signed)
Has questions about meds and was told to call if he had questions

## 2017-03-15 NOTE — Telephone Encounter (Signed)
No. start off at 50% and build back slowly

## 2017-03-15 NOTE — Telephone Encounter (Signed)
Patient states today that after doing 15 minutes on the treadmill and 15 minutes on stationary bike today and now he just feels really tired and has had to rest for a few hours. He wants to know if this is related to starting the Ranexa on Monday. Please advise.

## 2017-03-16 NOTE — Telephone Encounter (Signed)
Patient advised to begin exercise at 50% and do only 15 minutes on the treadmill and 15 minutes on the stationary bike and gradually build up exercise. Patient verbalized understanding.

## 2017-03-20 ENCOUNTER — Ambulatory Visit (INDEPENDENT_AMBULATORY_CARE_PROVIDER_SITE_OTHER): Payer: Medicare Other | Admitting: Cardiology

## 2017-03-20 VITALS — HR 55

## 2017-03-20 DIAGNOSIS — I25119 Atherosclerotic heart disease of native coronary artery with unspecified angina pectoris: Secondary | ICD-10-CM | POA: Diagnosis not present

## 2017-03-20 DIAGNOSIS — R079 Chest pain, unspecified: Secondary | ICD-10-CM | POA: Diagnosis not present

## 2017-03-20 NOTE — Progress Notes (Signed)
Patient arrived for a 1 week EKG after starting Ranexa. Patient states that he has had no issues since starting the medication and feels good. EKG reviewed by Dr. Dulce Sellar. Advised patient to continue current dose. Patient verbalized understanding. Patient will inform us when he hears back from [employer] paperwork.

## 2017-03-27 ENCOUNTER — Telehealth: Payer: Self-pay | Admitting: Cardiology

## 2017-03-27 NOTE — Telephone Encounter (Signed)
States he was told to call you when his meds ran low

## 2017-03-27 NOTE — Telephone Encounter (Signed)
OK 

## 2017-03-27 NOTE — Telephone Encounter (Signed)
Patient states that he doesn't feel good taking Ranexa and wants to stop taking it. Please advise.

## 2017-03-27 NOTE — Telephone Encounter (Signed)
Patient advised okay per Dr. Dulce Sellar to stop Ranexa.

## 2017-04-26 DIAGNOSIS — Z23 Encounter for immunization: Secondary | ICD-10-CM | POA: Diagnosis not present

## 2017-06-05 NOTE — Progress Notes (Signed)
Cardiology Office Note:    Date:  06/07/2017   ID:  James Armstrong, DOB 1941-04-22, MRN [id number]  PCP:  Paulina Fusi, MD  Cardiologist:  Norman Herrlich, MD    Referring MD: Paulina Fusi, MD    ASSESSMENT:    1. Coronary artery disease involving native coronary artery of native heart with angina pectoris (HCC)   2. Essential hypertension   3. Hyperlipidemia, unspecified hyperlipidemia type    PLAN:    In order of problems listed above:  1. Stable he has had no anginal discomfort is return to usual activities and is able to exercise on a regular basis achieving lifestyle goals.  He was intolerant of ranolazine and made him feel badly in general and weak and will continue current medical therapy with long-term dual antiplatelet beta-blocker and high intensity statin.  He was given a new prescription for nitroglycerin. 2. Stable continue current treatment beta-blocker 3. Stable continue high intensity statin, liver function renal function lipids are followed with his PCP copy of most recent labs requested   Next appointment: July 2019 when I returned to [facility]   Medication Adjustments/Labs and Tests Ordered: Current medicines are reviewed at length with the patient today.  Concerns regarding medicines are outlined above.  No orders of the defined types were placed in this encounter.  Meds ordered this encounter  Medications  . nitroGLYCERIN (NITROSTAT) SL tablet 0.3 mg    Chief Complaint  Patient presents with  . Follow-up  . Coronary Artery Disease    History of Present Illness:    James Armstrong is a 76 y.o. male with a hx of CAD with PCI / Resolute Drug Eluting Stent of the distal Left Main/Ostial LCF 08/09/16 for troponin normal unstable angina, Dyslipidemia, HTN, S/P CABG in 2003, CVA, with CEA and a small abdominal aneurysm  last seen 3 months ago. Compliance with diet, lifestyle and medications: Yes He is pleased with the quality of his life  is referring to return to full activity and has had no angina exertional dyspnea palpitations syncope or TIA he just notices that he does not have the endurance he had before and is in the habit of taking a nap daily Past Medical History:  Diagnosis Date  . Abdominal aortic aneurysm (AAA) 30 to 34 mm in diameter (HCC)   . Abdominal aortic aneurysm (HCC) 02/24/2016  . Asthma   . Chest pain 02/22/2017  . Coronary artery disease involving native coronary artery of native heart with angina pectoris (HCC) 02/24/2016   CABG in 2004  coronary angiography done 08/09/16 Abnormal Diagnostic Summary Chronic Total Occlusion of the RCA Severe stenosis of the LM, LAD, Circumflex Patent LIMA graft to the mid LAD. Patent SVG graft to the Ramus coronary artery. Moderate disease of SVG graft to the Posterior descending coronary artery. LV not done due to tortuosity of right subclavian Interventional Summary Successful PCI / Resolute Drug Eluting Stent of the distal Left Main/Ostial Circumflex Coronary Artery.      . Crohn disease (HCC)   . Essential hypertension 02/24/2016  . GERD (gastroesophageal reflux disease)   . Hx of CABG 02/24/2016   Overview:  In 2003  . Hyperlipemia 02/24/2016  . Shortness of breath 08/06/2016  . Sinus bradycardia 08/06/2016  . Stroke [facility])     Past Surgical History:  Procedure Laterality Date  . CARDIAC CATHETERIZATION    . CAROTID ENDARTERECTOMY    . CHOLECYSTECTOMY    . CORONARY ARTERY BYPASS GRAFT  2004  . HERNIA REPAIR      Current Medications: Current Meds  Medication Sig  . aspirin EC 81 MG tablet Take 81 mg by mouth daily.  . clopidogrel (PLAVIX) 75 MG tablet Take 75 mg by mouth daily.  . fenofibrate 160 MG tablet Take 160 mg by mouth daily.  . metoprolol tartrate (LOPRESSOR) 50 MG tablet Take 25 mg by mouth 2 (two) times daily.  Marland Kitchen. omeprazole (PRILOSEC) 20 MG capsule Take 40 mg by mouth at bedtime.  . rosuvastatin (CRESTOR) 40 MG tablet Take 40 mg by mouth daily.    Current Facility-Administered Medications for the 06/07/17 encounter (Office Visit) with Baldo DaubMunley, Brian J, MD  Medication  . nitroGLYCERIN (NITROSTAT) SL tablet 0.3 mg     Allergies:   Patient has no known allergies.   Social History   Socioeconomic History  . Marital status: Single    Spouse name: None  . Number of children: None  . Years of education: None  . Highest education level: None  Social Needs  . Financial resource strain: None  . Food insecurity - worry: None  . Food insecurity - inability: None  . Transportation needs - medical: None  . Transportation needs - non-medical: None  Occupational History  . None  Tobacco Use  . Smoking status: Never Smoker  . Smokeless tobacco: Never Used  Substance and Sexual Activity  . Alcohol use: No  . Drug use: No  . Sexual activity: None  Other Topics Concern  . None  Social History Narrative  . None     Family History: The patient's family history includes Diabetes in his brother; Heart attack in his brother; Stroke in his mother. ROS:   Please see the history of present illness.    All other systems reviewed and are negative.  EKGs/Labs/Other Studies Reviewed:    The following studies were reviewed today:    Recent Labs: No results found for requested labs within last 8760 hours.  Recent Lipid Panel No results found for: CHOL, TRIG, HDL, CHOLHDL, VLDL, LDLCALC, LDLDIRECT  Physical Exam:    VS:  BP 130/76 (BP Location: Right Arm, Patient Position: Sitting, Cuff Size: Normal)   Pulse (!) 55   Ht 5\' 6"  (1.676 m)   Wt 161 lb (73 kg)   SpO2 98%   BMI 25.99 kg/m     Wt Readings from Last 3 Encounters:  06/07/17 161 lb (73 kg)  03/08/17 161 lb 1.9 oz (73.1 kg)  01/05/17 161 lb 12.8 oz (73.4 kg)     GEN:  Well nourished, well developed in no acute distress HEENT: Normal NECK: No JVD; No carotid bruits LYMPHATICS: No lymphadenopathy CARDIAC: RRR, no murmurs, rubs, gallops RESPIRATORY:  Clear to  auscultation without rales, wheezing or rhonchi  ABDOMEN: Soft, non-tender, non-distended MUSCULOSKELETAL:  No edema; No deformity  SKIN: Warm and dry NEUROLOGIC:  Alert and oriented x 3 PSYCHIATRIC:  Normal affect    Signed, Norman HerrlichBrian Munley, MD  06/07/2017 8:29 AM    [facility]

## 2017-06-07 ENCOUNTER — Ambulatory Visit (INDEPENDENT_AMBULATORY_CARE_PROVIDER_SITE_OTHER): Payer: Medicare Other | Admitting: Cardiology

## 2017-06-07 ENCOUNTER — Encounter: Payer: Self-pay | Admitting: Cardiology

## 2017-06-07 VITALS — BP 130/76 | HR 55 | Ht 66.0 in | Wt 161.0 lb

## 2017-06-07 DIAGNOSIS — E785 Hyperlipidemia, unspecified: Secondary | ICD-10-CM

## 2017-06-07 DIAGNOSIS — I10 Essential (primary) hypertension: Secondary | ICD-10-CM | POA: Diagnosis not present

## 2017-06-07 DIAGNOSIS — I25119 Atherosclerotic heart disease of native coronary artery with unspecified angina pectoris: Secondary | ICD-10-CM

## 2017-06-07 MED ORDER — NITROGLYCERIN 0.3 MG SL SUBL: 0.4000 mg | SUBLINGUAL_TABLET | SUBLINGUAL | Status: DC | PRN

## 2017-06-07 MED ORDER — NITROGLYCERIN 0.4 MG SL SUBL: 0.4000 mg | SUBLINGUAL_TABLET | SUBLINGUAL | 12 refills | Status: DC | PRN

## 2017-06-07 NOTE — Patient Instructions (Signed)
Medication Instructions:  Your physician recommends that you continue on your current medications as directed. Please refer to the Current Medication list given to you today.   Labwork: None  Testing/Procedures: None  Follow-Up: Your physician wants you to follow-up in: July, 2019.  You will receive a reminder letter in the mail two months in advance. If you don't receive a letter, please call our office to schedule the follow-up appointment.   Any Other Special Instructions Will Be Listed Below (If Applicable).     If you need a refill on your cardiac medications before your next appointment, please call your pharmacy.

## 2017-07-14 DIAGNOSIS — Z951 Presence of aortocoronary bypass graft: Secondary | ICD-10-CM | POA: Diagnosis not present

## 2017-07-14 DIAGNOSIS — I2510 Atherosclerotic heart disease of native coronary artery without angina pectoris: Secondary | ICD-10-CM | POA: Diagnosis not present

## 2017-07-14 DIAGNOSIS — Z955 Presence of coronary angioplasty implant and graft: Secondary | ICD-10-CM | POA: Diagnosis not present

## 2017-07-14 DIAGNOSIS — Z7902 Long term (current) use of antithrombotics/antiplatelets: Secondary | ICD-10-CM | POA: Diagnosis not present

## 2017-07-14 DIAGNOSIS — Z9861 Coronary angioplasty status: Secondary | ICD-10-CM | POA: Diagnosis not present

## 2017-07-14 DIAGNOSIS — Z9889 Other specified postprocedural states: Secondary | ICD-10-CM | POA: Diagnosis not present

## 2017-07-14 DIAGNOSIS — R079 Chest pain, unspecified: Secondary | ICD-10-CM | POA: Diagnosis not present

## 2017-07-14 DIAGNOSIS — R0602 Shortness of breath: Secondary | ICD-10-CM | POA: Diagnosis not present

## 2017-07-14 DIAGNOSIS — Z7982 Long term (current) use of aspirin: Secondary | ICD-10-CM | POA: Diagnosis not present

## 2017-07-14 DIAGNOSIS — Z79899 Other long term (current) drug therapy: Secondary | ICD-10-CM | POA: Diagnosis not present

## 2017-07-14 DIAGNOSIS — R0789 Other chest pain: Secondary | ICD-10-CM | POA: Diagnosis not present

## 2017-07-14 DIAGNOSIS — R5383 Other fatigue: Secondary | ICD-10-CM | POA: Diagnosis not present

## 2017-07-14 DIAGNOSIS — E782 Mixed hyperlipidemia: Secondary | ICD-10-CM | POA: Diagnosis not present

## 2017-07-15 DIAGNOSIS — E782 Mixed hyperlipidemia: Secondary | ICD-10-CM

## 2017-07-15 DIAGNOSIS — I2510 Atherosclerotic heart disease of native coronary artery without angina pectoris: Secondary | ICD-10-CM | POA: Insufficient documentation

## 2017-07-15 DIAGNOSIS — Z955 Presence of coronary angioplasty implant and graft: Secondary | ICD-10-CM | POA: Diagnosis not present

## 2017-07-15 DIAGNOSIS — Z7902 Long term (current) use of antithrombotics/antiplatelets: Secondary | ICD-10-CM | POA: Diagnosis not present

## 2017-07-15 DIAGNOSIS — Z951 Presence of aortocoronary bypass graft: Secondary | ICD-10-CM | POA: Diagnosis not present

## 2017-07-15 DIAGNOSIS — Z7982 Long term (current) use of aspirin: Secondary | ICD-10-CM | POA: Diagnosis not present

## 2017-07-15 DIAGNOSIS — Z9861 Coronary angioplasty status: Secondary | ICD-10-CM | POA: Diagnosis not present

## 2017-07-15 HISTORY — DX: Mixed hyperlipidemia: E78.2

## 2017-07-15 HISTORY — DX: Atherosclerotic heart disease of native coronary artery without angina pectoris: I25.10

## 2017-07-17 ENCOUNTER — Encounter: Payer: Self-pay | Admitting: Cardiology

## 2017-07-17 ENCOUNTER — Ambulatory Visit (INDEPENDENT_AMBULATORY_CARE_PROVIDER_SITE_OTHER): Payer: Medicare Other | Admitting: Cardiology

## 2017-07-17 ENCOUNTER — Other Ambulatory Visit: Payer: Self-pay

## 2017-07-17 VITALS — BP 144/74 | HR 68 | Ht 66.0 in | Wt 160.0 lb

## 2017-07-17 DIAGNOSIS — R079 Chest pain, unspecified: Secondary | ICD-10-CM

## 2017-07-17 DIAGNOSIS — I25110 Atherosclerotic heart disease of native coronary artery with unstable angina pectoris: Secondary | ICD-10-CM | POA: Diagnosis not present

## 2017-07-17 MED ORDER — ATORVASTATIN CALCIUM 40 MG PO TABS
40.00 | ORAL_TABLET | ORAL | Status: DC
Start: ? — End: 2017-07-17

## 2017-07-17 MED ORDER — ACETAMINOPHEN 325 MG PO TABS
650.00 | ORAL_TABLET | ORAL | Status: DC
Start: ? — End: 2017-07-17

## 2017-07-17 MED ORDER — PANTOPRAZOLE SODIUM 40 MG PO TBEC
40.00 | DELAYED_RELEASE_TABLET | ORAL | Status: DC
Start: 2017-07-16 — End: 2017-07-17

## 2017-07-17 MED ORDER — ASPIRIN 81 MG PO CHEW
81.00 | CHEWABLE_TABLET | ORAL | Status: DC
Start: 2017-07-16 — End: 2017-07-17

## 2017-07-17 MED ORDER — ONDANSETRON HCL 4 MG/2ML IJ SOLN
4.00 | INTRAMUSCULAR | Status: DC
Start: ? — End: 2017-07-17

## 2017-07-17 MED ORDER — NITROGLYCERIN 0.4 MG SL SUBL
0.40 | SUBLINGUAL_TABLET | SUBLINGUAL | Status: DC
Start: ? — End: 2017-07-17

## 2017-07-17 MED ORDER — ENOXAPARIN SODIUM 40 MG/0.4ML ~~LOC~~ SOLN
40.00 | SUBCUTANEOUS | Status: DC
Start: ? — End: 2017-07-17

## 2017-07-17 MED ORDER — CLOPIDOGREL BISULFATE 75 MG PO TABS
75.00 | ORAL_TABLET | ORAL | Status: DC
Start: 2017-07-16 — End: 2017-07-17

## 2017-07-17 MED ORDER — METOPROLOL SUCCINATE ER 25 MG PO TB24
25.00 | ORAL_TABLET | ORAL | Status: DC
Start: 2017-07-15 — End: 2017-07-17

## 2017-07-17 NOTE — H&P (View-Only) (Signed)
Cardiology Office Note:    Date:  07/17/2017   ID:  James Armstrong, DOB 1940/09/07, MRN [id number]  PCP:  James Fusi, MD  Cardiologist:  James Herrlich, MD    Referring MD: James Fusi, MD    ASSESSMENT:    1. Coronary artery disease involving native heart with unstable angina pectoris, unspecified vessel or lesion type (HCC)   2. Chest pain, unspecified type    PLAN:    In order of problems listed above:  1. With his recent presentation at [facility] hospital with what I interpreted as unstable angina prolonged chest pain at rest with ischemic T wave changes advised to undergo coronary angiography.  Benefits risk and options detailed of a copy of the last 2 heart catheterizations from 2018 forwarded to the Cath Lab at [facility].  I suspect the culprit is either the left main coronary artery stent performed in February 2018 or the residual disease in the saphenous vein graft to the posterior descending artery he understands he may require further PCI and is in agreement. 2. See discussions above   Next appointment: 2 weeks   Medication Adjustments/Labs and Tests Ordered: Current medicines are reviewed at length with the patient today.  Concerns regarding medicines are outlined above.  Orders Placed This Encounter  Procedures  . Protime-INR  . CBC with Differential/Platelet  . Basic metabolic panel  . EKG 12-Lead   No orders of the defined types were placed in this encounter.   Chief Complaint  Patient presents with  . Coronary Artery Disease    History of Present Illness:    James Armstrong is a 77 y.o. male with a hx of CAD with PCI / Resolute Drug Eluting Stent of the distal Left Main/Ostial LCF 08/09/16 for troponin normal unstable angina, Dyslipidemia, HTN, S/P CABG in 2003, CVA, with CEA and a small abdominal aneurysm seen at [facility] 07/14/17. He was admitted with chest pain, all 3 troponin were normal.  Admit date:  07/14/2017 Discharge date: 07/15/2017  Discharge Service: [facility] Hospitalist Discharge Diagnoses: Principal Problem (Resolved): Chest pain Active Problems: Presence of stent in coronary artery in patient with coronary artery disease Hx of CABG Mixed dyslipidemia Multiple vessel coronary artery disease Hospital Course:  Patient is 77 year old Caucasian male with significant past medical history of CAD status post CABG in 2004, PCI/stent placement in February 2000, hyperlipidemia and carotid endarterectomy. Patient has a history of multiple CAD and had a drug-eluting stent placed in February 2018 to the distal left main/ostial circumflex coronary artery. Patient was again seen in August 2018 with chest pain and had a repeat angiogram done which showed evidence of multivessel CAD unchanged from previous workup and medical therapy was recommended. Patient has continued to follow-up with cardiologist closely. He was admitted on 07/14/2017 with chest pain to telemetry and had serial cardiac enzymes done which were negative. Patient received some nitroglycerin. On review of care everywhere cardiology recommended outpatient stress test after the last catheterization in August 2018 if there is a repeat chest pain to localize ischemia. Patient is currently chest pain-free and cardiac enzymes are normal. He will be discharged home today and outpatient nuclear stress test has been arranged via the HART line. Patient is stable and will be discharged home. Patient is already on dual antiplatelet medication, beta-blocker and statin. The last echocardiogram done in August 2018 shows an EF of 55%. Post Discharge Follow Up Issues:  Outpatient Lexiscan stress test   EKG:  Ventricular Rate 65BPM   Atrial Rate65BPM   P-R Interval 228 ms  QRS Duration  86ms  Q-T Interval 392 ms  QTC407 ms  P Axis 40degrees   R Axis 48degrees   T Axis 72degrees   Sinus rhythm First degree A-V block  Anterior infarct , age undetermined  When compared with ECG of 23-Feb-2017 10:01,  Anterior infarct now present   T wave inversion now evident in anterior leads  Confirmed by James Armstrong [phone]) on 07/16/2017 11:08:24 AM    Left heart cath 02/23/17:Conclusions  Diagnostic Procedure Summary  Multivessel CAD.  Severe stenosis of the LAD  Chronic Total Occlusion of the RCA, Ramus  Moderate stenosis of the LM, Circumflex  Patent LIMA graft to the mid LAD.  Patent SVG graft to the Ramus coronary artery.  Moderate disease of SVG graft to the Posterior descending coronary artery.  Normal LV systolic function.  LV ejection fraction is 65 %  Diagnostic Procedure Recommendations  Visually there is little change since the last procedure.   Compliance with diet, lifestyle and medications: Yes  He had done very well until a few days prior to admission at [facility] regional hospital prior to that he had traveled to [facility] went to the last football game of the season [employer] walked a long distance and is having no exercise intolerance or angina.  Several days prior to hospitalization he did not feel well he was weak had exercise intolerance and vague exertional and nonexertional chest discomfort the day of admission he had typical angina substernal pressure incompletely relieved with nitroglycerin presented to [facility] regional had EKG that describes new ischemic T wave inversion.  My last in the chart.  Serial cardiac enzymes were normal and he was discharged for  outpatient follow-up.  At this time he just does not feel well he has had no further chest pain.  He has had 2 heart catheterizations in the last year the first in February when he had PCI and stent his native left main and the second were no intervention was done he did have residual disease in the vein graft described as moderate to the posterior descending artery.  With his known CAD high risk of recurrent ischemia and recent presentation consistent with unstable angina I advised to undergo coronary angiography with possible intervention benefits risk and options detailed patient agrees he has no dye allergy renal insufficiency or contraindication to dual antiplatelet therapy.  In the interim we will continue his current medical treatment long-term dual antiplatelet high intensity statin and beta-blocker.  If he has recurrent chest pain will start oral nitrates.  In the past he was intolerant of ranolazine. Past Medical History:  Diagnosis Date  . Abdominal aortic aneurysm (AAA) 30 to 34 mm in diameter (HCC)   . Abdominal aortic aneurysm (HCC) 02/24/2016  . Asthma   . Chest pain 02/22/2017  . Coronary artery disease involving native coronary artery of native heart with angina pectoris (HCC) 02/24/2016   CABG in 2004  coronary angiography done 08/09/16 Abnormal Diagnostic Summary Chronic Total Occlusion of the RCA Severe stenosis of the LM, LAD, Circumflex Patent LIMA graft to the mid LAD. Patent SVG graft to the Ramus coronary artery. Moderate disease of SVG graft to the Posterior descending coronary artery. LV not done due to tortuosity of right subclavian Interventional Summary Successful PCI / Resolute Drug Eluting Stent of the distal Left Main/Ostial Circumflex Coronary Artery.      . Crohn disease (HCC)   .  Essential hypertension 02/24/2016  . GERD (gastroesophageal reflux disease)   . Hx of CABG 02/24/2016   Overview:  In 2003  . Hyperlipemia 02/24/2016  . Shortness of breath 08/06/2016  . Sinus  bradycardia 08/06/2016  . Stroke [facility])     Past Surgical History:  Procedure Laterality Date  . CARDIAC CATHETERIZATION    . CAROTID ENDARTERECTOMY    . CHOLECYSTECTOMY    . CORONARY ARTERY BYPASS GRAFT     2004  . HERNIA REPAIR      Current Medications: Current Meds  Medication Sig  . aspirin EC 81 MG tablet Take 81 mg by mouth daily.  . clopidogrel (PLAVIX) 75 MG tablet Take 75 mg by mouth daily.  . fenofibrate 160 MG tablet Take 160 mg by mouth daily.  . metoprolol tartrate (LOPRESSOR) 50 MG tablet Take 25 mg by mouth 2 (two) times daily.  Marland Kitchen omeprazole (PRILOSEC) 20 MG capsule Take 40 mg by mouth at bedtime.  . rosuvastatin (CRESTOR) 40 MG tablet Take 40 mg by mouth daily.     Allergies:   Ranexa [ranolazine]   Social History   Socioeconomic History  . Marital status: Single    Spouse name: None  . Number of children: None  . Years of education: None  . Highest education level: None  Social Needs  . Financial resource strain: None  . Food insecurity - worry: None  . Food insecurity - inability: None  . Transportation needs - medical: None  . Transportation needs - non-medical: None  Occupational History  . None  Tobacco Use  . Smoking status: Never Smoker  . Smokeless tobacco: Never Used  Substance and Sexual Activity  . Alcohol use: No  . Drug use: No  . Sexual activity: None  Other Topics Concern  . None  Social History Narrative  . None     Family History: The patient's family history includes Diabetes in his brother; Heart attack in his brother; Stroke in his mother. ROS:   Please see the history of present illness.    All other systems reviewed and are negative.  EKGs/Labs/Other Studies Reviewed:    The following studies were reviewed today:  EKG:  EKG ordered today.  The ekg ordered today demonstrates sinus rhythm normal EKG ischemic T wave inversion described on his last EKG is no longer present  Recent Labs: No results found for requested  labs within last 8760 hours.  Recent Lipid Panel No results found for: CHOL, TRIG, HDL, CHOLHDL, VLDL, LDLCALC, LDLDIRECT  Physical Exam:    VS:  BP (!) 144/74 (BP Location: Right Arm, Patient Position: Sitting, Cuff Size: Large)   Pulse 68   Ht 5\' 6"  (1.676 m)   Wt 160 lb (72.6 kg)   SpO2 98%   BMI 25.82 kg/m     Wt Readings from Last 3 Encounters:  07/17/17 160 lb (72.6 kg)  06/07/17 161 lb (73 kg)  03/08/17 161 lb 1.9 oz (73.1 kg)     GEN: He appears apprehensive well nourished, well developed in no acute distress HEENT: Normal NECK: No JVD; No carotid bruits LYMPHATICS: No lymphadenopathy CARDIAC: RRR, no murmurs, rubs, gallops RESPIRATORY:  Clear to auscultation without rales, wheezing or rhonchi  ABDOMEN: Soft, non-tender, non-distended MUSCULOSKELETAL:  No edema; No deformity  SKIN: Warm and dry NEUROLOGIC:  Alert and oriented x 3 PSYCHIATRIC:  Normal affect    Signed, James Herrlich, MD  07/17/2017 4:27 PM    [facility]

## 2017-07-17 NOTE — Patient Instructions (Addendum)
Medication Instructions:  Your physician recommends that you continue on your current medications as directed. Please refer to the Current Medication list given to you today.  Labwork: Your physician recommends that you return for lab work in: today. CBC, BMP, pt/inr  Testing/Procedures: You had an EKG today.  Your physician has requested that you have a cardiac catheterization. Cardiac catheterization is used to diagnose and/or treat various heart conditions. Doctors may recommend this procedure for a number of different reasons. The most common reason is to evaluate chest pain. Chest pain can be a symptom of coronary artery disease (CAD), and cardiac catheterization can show whether plaque is narrowing or blocking your heart's arteries. This procedure is also used to evaluate the valves, as well as measure the blood flow and oxygen levels in different parts of your heart. For further information please visit [email or web address] Please follow instruction sheet, as given.    [facility]ARDIOVASCULAR DIVISION [facility] [place] [address] [place] [address] Dept: [phone] Loc: [phone]  YORK HARCUM  07/17/2017  You are scheduled for a Cardiac Catheterization on Wednesday, January 23 with Dr. Verne Carrow.  1. Please arrive at the [facility] (Main Entrance A) at [facility]: [address] [place], [place] [address] at 6:30 AM (two hours before your procedure to ensure your preparation). Free valet parking service is available.   Special note: Every effort is made to have your procedure done on time. Please understand that emergencies sometimes delay scheduled procedures.  2. Diet: Do not eat or drink anything after midnight prior to your procedure except sips of water to take medications.  3. Labs: We are checking today.  4. Medication instructions in preparation for your procedure:  On the  morning of your procedure, take your Plavix/Clopidogrel, aspirin and any morning medicines NOT listed above.  You may use sips of water.  5. Plan for one night stay--bring personal belongings. 6. Bring a current list of your medications and current insurance cards. 7. You MUST have a responsible person to drive you home. 8. Someone MUST be with you the first 24 hours after you arrive home or your discharge will be delayed. 9. Please wear clothes that are easy to get on and off and wear slip-on shoes.  Thank you for allowing [place] to care for you!   -- [facility]   Follow-Up: Your physician recommends that you schedule a follow-up appointment in: 2 weeks.  Any Other Special Instructions Will Be Listed Below (If Applicable).     If you need a refill on your cardiac medications before your next appointment, please call your pharmacy.

## 2017-07-17 NOTE — Progress Notes (Signed)
Cardiology Office Note:    Date:  07/17/2017   ID:  James Armstrong, DOB 1940/09/07, MRN [id number]  PCP:  Paulina Fusi, MD  Cardiologist:  Norman Herrlich, MD    Referring MD: Paulina Fusi, MD    ASSESSMENT:    1. Coronary artery disease involving native heart with unstable angina pectoris, unspecified vessel or lesion type (HCC)   2. Chest pain, unspecified type    PLAN:    In order of problems listed above:  1. With his recent presentation at [facility] hospital with what I interpreted as unstable angina prolonged chest pain at rest with ischemic T wave changes advised to undergo coronary angiography.  Benefits risk and options detailed of a copy of the last 2 heart catheterizations from 2018 forwarded to the Cath Lab at [facility].  I suspect the culprit is either the left main coronary artery stent performed in February 2018 or the residual disease in the saphenous vein graft to the posterior descending artery he understands he may require further PCI and is in agreement. 2. See discussions above   Next appointment: 2 weeks   Medication Adjustments/Labs and Tests Ordered: Current medicines are reviewed at length with the patient today.  Concerns regarding medicines are outlined above.  Orders Placed This Encounter  Procedures  . Protime-INR  . CBC with Differential/Platelet  . Basic metabolic panel  . EKG 12-Lead   No orders of the defined types were placed in this encounter.   Chief Complaint  Patient presents with  . Coronary Artery Disease    History of Present Illness:    James Armstrong is a 77 y.o. male with a hx of CAD with PCI / Resolute Drug Eluting Stent of the distal Left Main/Ostial LCF 08/09/16 for troponin normal unstable angina, Dyslipidemia, HTN, S/P CABG in 2003, CVA, with CEA and a small abdominal aneurysm seen at [facility] 07/14/17. He was admitted with chest pain, all 3 troponin were normal.  Admit date:  07/14/2017 Discharge date: 07/15/2017  Discharge Service: [facility] Hospitalist Discharge Diagnoses: Principal Problem (Resolved): Chest pain Active Problems: Presence of stent in coronary artery in patient with coronary artery disease Hx of CABG Mixed dyslipidemia Multiple vessel coronary artery disease Hospital Course:  Patient is 77 year old Caucasian male with significant past medical history of CAD status post CABG in 2004, PCI/stent placement in February 2000, hyperlipidemia and carotid endarterectomy. Patient has a history of multiple CAD and had a drug-eluting stent placed in February 2018 to the distal left main/ostial circumflex coronary artery. Patient was again seen in August 2018 with chest pain and had a repeat angiogram done which showed evidence of multivessel CAD unchanged from previous workup and medical therapy was recommended. Patient has continued to follow-up with cardiologist closely. He was admitted on 07/14/2017 with chest pain to telemetry and had serial cardiac enzymes done which were negative. Patient received some nitroglycerin. On review of care everywhere cardiology recommended outpatient stress test after the last catheterization in August 2018 if there is a repeat chest pain to localize ischemia. Patient is currently chest pain-free and cardiac enzymes are normal. He will be discharged home today and outpatient nuclear stress test has been arranged via the HART line. Patient is stable and will be discharged home. Patient is already on dual antiplatelet medication, beta-blocker and statin. The last echocardiogram done in August 2018 shows an EF of 55%. Post Discharge Follow Up Issues:  Outpatient Lexiscan stress test   EKG:  Ventricular Rate 65BPM   Atrial Rate65BPM   P-R Interval 228 ms  QRS Duration  86ms  Q-T Interval 392 ms  QTC407 ms  P Axis 40degrees   R Axis 48degrees   T Axis 72degrees   Sinus rhythm First degree A-V block  Anterior infarct , age undetermined  When compared with ECG of 23-Feb-2017 10:01,  Anterior infarct now present   T wave inversion now evident in anterior leads  Confirmed by Ginger Carne [phone]) on 07/16/2017 11:08:24 AM    Left heart cath 02/23/17:Conclusions  Diagnostic Procedure Summary  Multivessel CAD.  Severe stenosis of the LAD  Chronic Total Occlusion of the RCA, Ramus  Moderate stenosis of the LM, Circumflex  Patent LIMA graft to the mid LAD.  Patent SVG graft to the Ramus coronary artery.  Moderate disease of SVG graft to the Posterior descending coronary artery.  Normal LV systolic function.  LV ejection fraction is 65 %  Diagnostic Procedure Recommendations  Visually there is little change since the last procedure.   Compliance with diet, lifestyle and medications: Yes  He had done very well until a few days prior to admission at [facility] regional hospital prior to that he had traveled to [facility] went to the last football game of the season [employer] walked a long distance and is having no exercise intolerance or angina.  Several days prior to hospitalization he did not feel well he was weak had exercise intolerance and vague exertional and nonexertional chest discomfort the day of admission he had typical angina substernal pressure incompletely relieved with nitroglycerin presented to [facility] regional had EKG that describes new ischemic T wave inversion.  My last in the chart.  Serial cardiac enzymes were normal and he was discharged for  outpatient follow-up.  At this time he just does not feel well he has had no further chest pain.  He has had 2 heart catheterizations in the last year the first in February when he had PCI and stent his native left main and the second were no intervention was done he did have residual disease in the vein graft described as moderate to the posterior descending artery.  With his known CAD high risk of recurrent ischemia and recent presentation consistent with unstable angina I advised to undergo coronary angiography with possible intervention benefits risk and options detailed patient agrees he has no dye allergy renal insufficiency or contraindication to dual antiplatelet therapy.  In the interim we will continue his current medical treatment long-term dual antiplatelet high intensity statin and beta-blocker.  If he has recurrent chest pain will start oral nitrates.  In the past he was intolerant of ranolazine. Past Medical History:  Diagnosis Date  . Abdominal aortic aneurysm (AAA) 30 to 34 mm in diameter (HCC)   . Abdominal aortic aneurysm (HCC) 02/24/2016  . Asthma   . Chest pain 02/22/2017  . Coronary artery disease involving native coronary artery of native heart with angina pectoris (HCC) 02/24/2016   CABG in 2004  coronary angiography done 08/09/16 Abnormal Diagnostic Summary Chronic Total Occlusion of the RCA Severe stenosis of the LM, LAD, Circumflex Patent LIMA graft to the mid LAD. Patent SVG graft to the Ramus coronary artery. Moderate disease of SVG graft to the Posterior descending coronary artery. LV not done due to tortuosity of right subclavian Interventional Summary Successful PCI / Resolute Drug Eluting Stent of the distal Left Main/Ostial Circumflex Coronary Artery.      . Crohn disease (HCC)   .  Essential hypertension 02/24/2016  . GERD (gastroesophageal reflux disease)   . Hx of CABG 02/24/2016   Overview:  In 2003  . Hyperlipemia 02/24/2016  . Shortness of breath 08/06/2016  . Sinus  bradycardia 08/06/2016  . Stroke [facility])     Past Surgical History:  Procedure Laterality Date  . CARDIAC CATHETERIZATION    . CAROTID ENDARTERECTOMY    . CHOLECYSTECTOMY    . CORONARY ARTERY BYPASS GRAFT     2004  . HERNIA REPAIR      Current Medications: Current Meds  Medication Sig  . aspirin EC 81 MG tablet Take 81 mg by mouth daily.  . clopidogrel (PLAVIX) 75 MG tablet Take 75 mg by mouth daily.  . fenofibrate 160 MG tablet Take 160 mg by mouth daily.  . metoprolol tartrate (LOPRESSOR) 50 MG tablet Take 25 mg by mouth 2 (two) times daily.  Marland Kitchen omeprazole (PRILOSEC) 20 MG capsule Take 40 mg by mouth at bedtime.  . rosuvastatin (CRESTOR) 40 MG tablet Take 40 mg by mouth daily.     Allergies:   Ranexa [ranolazine]   Social History   Socioeconomic History  . Marital status: Single    Spouse name: None  . Number of children: None  . Years of education: None  . Highest education level: None  Social Needs  . Financial resource strain: None  . Food insecurity - worry: None  . Food insecurity - inability: None  . Transportation needs - medical: None  . Transportation needs - non-medical: None  Occupational History  . None  Tobacco Use  . Smoking status: Never Smoker  . Smokeless tobacco: Never Used  Substance and Sexual Activity  . Alcohol use: No  . Drug use: No  . Sexual activity: None  Other Topics Concern  . None  Social History Narrative  . None     Family History: The patient's family history includes Diabetes in his brother; Heart attack in his brother; Stroke in his mother. ROS:   Please see the history of present illness.    All other systems reviewed and are negative.  EKGs/Labs/Other Studies Reviewed:    The following studies were reviewed today:  EKG:  EKG ordered today.  The ekg ordered today demonstrates sinus rhythm normal EKG ischemic T wave inversion described on his last EKG is no longer present  Recent Labs: No results found for requested  labs within last 8760 hours.  Recent Lipid Panel No results found for: CHOL, TRIG, HDL, CHOLHDL, VLDL, LDLCALC, LDLDIRECT  Physical Exam:    VS:  BP (!) 144/74 (BP Location: Right Arm, Patient Position: Sitting, Cuff Size: Large)   Pulse 68   Ht 5\' 6"  (1.676 m)   Wt 160 lb (72.6 kg)   SpO2 98%   BMI 25.82 kg/m     Wt Readings from Last 3 Encounters:  07/17/17 160 lb (72.6 kg)  06/07/17 161 lb (73 kg)  03/08/17 161 lb 1.9 oz (73.1 kg)     GEN: He appears apprehensive well nourished, well developed in no acute distress HEENT: Normal NECK: No JVD; No carotid bruits LYMPHATICS: No lymphadenopathy CARDIAC: RRR, no murmurs, rubs, gallops RESPIRATORY:  Clear to auscultation without rales, wheezing or rhonchi  ABDOMEN: Soft, non-tender, non-distended MUSCULOSKELETAL:  No edema; No deformity  SKIN: Warm and dry NEUROLOGIC:  Alert and oriented x 3 PSYCHIATRIC:  Normal affect    Signed, Norman Herrlich, MD  07/17/2017 4:27 PM    [facility]

## 2017-07-18 ENCOUNTER — Telehealth: Payer: Self-pay

## 2017-07-18 LAB — CBC WITH DIFFERENTIAL/PLATELET
BASOS ABS: 0 10*3/uL (ref 0.0–0.2)
Basos: 1 %
EOS (ABSOLUTE): 0.5 10*3/uL — ABNORMAL HIGH (ref 0.0–0.4)
EOS: 7 %
HEMATOCRIT: 39.4 % (ref 37.5–51.0)
HEMOGLOBIN: 13.1 g/dL (ref 13.0–17.7)
IMMATURE GRANS (ABS): 0 10*3/uL (ref 0.0–0.1)
Immature Granulocytes: 0 %
LYMPHS: 40 %
Lymphocytes Absolute: 3 10*3/uL (ref 0.7–3.1)
MCH: 30.2 pg (ref 26.6–33.0)
MCHC: 33.2 g/dL (ref 31.5–35.7)
MCV: 91 fL (ref 79–97)
MONOCYTES: 10 %
Monocytes Absolute: 0.8 10*3/uL (ref 0.1–0.9)
NEUTROS ABS: 3.2 10*3/uL (ref 1.4–7.0)
Neutrophils: 42 %
Platelets: 256 10*3/uL (ref 150–379)
RBC: 4.34 x10E6/uL (ref 4.14–5.80)
RDW: 13.3 % (ref 12.3–15.4)
WBC: 7.5 10*3/uL (ref 3.4–10.8)

## 2017-07-18 LAB — BASIC METABOLIC PANEL
BUN/Creatinine Ratio: 17 (ref 10–24)
BUN: 18 mg/dL (ref 8–27)
CO2: 22 mmol/L (ref 20–29)
CREATININE: 1.06 mg/dL (ref 0.76–1.27)
Calcium: 10 mg/dL (ref 8.6–10.2)
Chloride: 103 mmol/L (ref 96–106)
GFR calc Af Amer: 78 mL/min/{1.73_m2} (ref >59)
GFR, EST NON AFRICAN AMERICAN: 68 mL/min/{1.73_m2} (ref >59)
GLUCOSE: 88 mg/dL (ref 65–99)
Potassium: 4.2 mmol/L (ref 3.5–5.2)
Sodium: 144 mmol/L (ref 134–144)

## 2017-07-18 LAB — PROTIME-INR
INR: 1.1 (ref 0.8–1.2)
PROTHROMBIN TIME: 11.4 s (ref 9.1–12.0)

## 2017-07-18 NOTE — Telephone Encounter (Signed)
Could not leave patient a voicemail regarding his results. Will try again tomorrow.

## 2017-07-24 ENCOUNTER — Telehealth: Payer: Self-pay

## 2017-07-24 NOTE — Telephone Encounter (Signed)
Patient contacted pre-catheterization at [facility] scheduled for:  07/26/2017 @ 0830 Verified arrival time and place:  NT @ 0630 Confirmed AM meds to be taken pre-cath with sip of water: Take ASA/plavix Confirmed patient has responsible person to drive home post procedure and observe patient for 24 hours:  yes Addl concerns:  none

## 2017-07-26 ENCOUNTER — Encounter (HOSPITAL_COMMUNITY)
Admission: RE | Disposition: A | Discharge status: Home / Self Care | Payer: Self-pay | Source: Ambulatory Visit | Attending: Cardiovascular Disease

## 2017-07-26 ENCOUNTER — Encounter (HOSPITAL_COMMUNITY): Payer: Self-pay | Admitting: Cardiovascular Disease

## 2017-07-26 ENCOUNTER — Ambulatory Visit (HOSPITAL_COMMUNITY)
Admission: RE | Admit: 2017-07-26 | Discharge: 2017-07-26 | Disposition: A | Discharge status: Home / Self Care | Payer: Medicare Other | Source: Ambulatory Visit | Attending: Cardiovascular Disease | Admitting: Cardiovascular Disease

## 2017-07-26 DIAGNOSIS — I714 Abdominal aortic aneurysm, without rupture: Secondary | ICD-10-CM | POA: Diagnosis not present

## 2017-07-26 DIAGNOSIS — Z8249 Family history of ischemic heart disease and other diseases of the circulatory system: Secondary | ICD-10-CM | POA: Diagnosis not present

## 2017-07-26 DIAGNOSIS — K219 Gastro-esophageal reflux disease without esophagitis: Secondary | ICD-10-CM | POA: Insufficient documentation

## 2017-07-26 DIAGNOSIS — I2582 Chronic total occlusion of coronary artery: Secondary | ICD-10-CM | POA: Insufficient documentation

## 2017-07-26 DIAGNOSIS — I10 Essential (primary) hypertension: Secondary | ICD-10-CM | POA: Diagnosis not present

## 2017-07-26 DIAGNOSIS — I25110 Atherosclerotic heart disease of native coronary artery with unstable angina pectoris: Secondary | ICD-10-CM | POA: Diagnosis not present

## 2017-07-26 DIAGNOSIS — Z8673 Personal history of transient ischemic attack (TIA), and cerebral infarction without residual deficits: Secondary | ICD-10-CM | POA: Diagnosis not present

## 2017-07-26 DIAGNOSIS — Z7982 Long term (current) use of aspirin: Secondary | ICD-10-CM | POA: Diagnosis not present

## 2017-07-26 DIAGNOSIS — Z79899 Other long term (current) drug therapy: Secondary | ICD-10-CM | POA: Diagnosis not present

## 2017-07-26 DIAGNOSIS — Z951 Presence of aortocoronary bypass graft: Secondary | ICD-10-CM | POA: Insufficient documentation

## 2017-07-26 DIAGNOSIS — Z7902 Long term (current) use of antithrombotics/antiplatelets: Secondary | ICD-10-CM | POA: Insufficient documentation

## 2017-07-26 DIAGNOSIS — Z955 Presence of coronary angioplasty implant and graft: Secondary | ICD-10-CM | POA: Diagnosis not present

## 2017-07-26 DIAGNOSIS — E785 Hyperlipidemia, unspecified: Secondary | ICD-10-CM | POA: Insufficient documentation

## 2017-07-26 DIAGNOSIS — Z823 Family history of stroke: Secondary | ICD-10-CM | POA: Insufficient documentation

## 2017-07-26 HISTORY — PX: LEFT HEART CATH AND CORS/GRAFTS ANGIOGRAPHY: CATH118250

## 2017-07-26 SURGERY — LEFT HEART CATH AND CORS/GRAFTS ANGIOGRAPHY
Anesthesia: LOCAL

## 2017-07-26 MED ORDER — SODIUM CHLORIDE 0.9 % IV SOLN: 250.0000 mL | INTRAVENOUS | Status: DC | PRN

## 2017-07-26 MED ORDER — FENTANYL CITRATE (PF) 100 MCG/2ML IJ SOLN
INTRAMUSCULAR | Status: DC | PRN
  Administered 2017-07-26: 25 ug via INTRAVENOUS

## 2017-07-26 MED ORDER — SODIUM CHLORIDE 0.9% FLUSH: 3.0000 mL | Freq: Two times a day (BID) | INTRAVENOUS | Status: DC

## 2017-07-26 MED ORDER — ASPIRIN 81 MG PO CHEW: 81.0000 mg | CHEWABLE_TABLET | ORAL | Status: DC

## 2017-07-26 MED ORDER — MIDAZOLAM HCL 2 MG/2ML IJ SOLN
INTRAMUSCULAR | Status: AC
  Filled 2017-07-26: qty 2

## 2017-07-26 MED ORDER — HEPARIN (PORCINE) IN NACL 2-0.9 UNIT/ML-% IJ SOLN
INTRAMUSCULAR | Status: AC
  Filled 2017-07-26: qty 1000

## 2017-07-26 MED ORDER — LIDOCAINE HCL 1 % IJ SOLN
INTRAMUSCULAR | Status: AC
  Filled 2017-07-26: qty 20

## 2017-07-26 MED ORDER — SODIUM CHLORIDE 0.9% FLUSH: 3.0000 mL | INTRAVENOUS | Status: DC | PRN

## 2017-07-26 MED ORDER — IOPAMIDOL (ISOVUE-370) INJECTION 76%
INTRAVENOUS | Status: DC | PRN
  Administered 2017-07-26: 150 mL

## 2017-07-26 MED ORDER — LIDOCAINE HCL (PF) 1 % IJ SOLN
INTRAMUSCULAR | Status: DC | PRN
  Administered 2017-07-26: 18 mL

## 2017-07-26 MED ORDER — IOPAMIDOL (ISOVUE-370) INJECTION 76%
INTRAVENOUS | Status: AC
  Filled 2017-07-26: qty 125

## 2017-07-26 MED ORDER — FENTANYL CITRATE (PF) 100 MCG/2ML IJ SOLN
INTRAMUSCULAR | Status: AC
  Filled 2017-07-26: qty 2

## 2017-07-26 MED ORDER — SODIUM CHLORIDE 0.9 % WEIGHT BASED INFUSION: 1.0000 mL/kg/h | INTRAVENOUS | Status: DC

## 2017-07-26 MED ORDER — SODIUM CHLORIDE 0.9 % IV SOLN: INTRAVENOUS | Status: AC

## 2017-07-26 MED ORDER — HEPARIN (PORCINE) IN NACL 2-0.9 UNIT/ML-% IJ SOLN
INTRAMUSCULAR | Status: DC | PRN
  Administered 2017-07-26: 09:00:00

## 2017-07-26 MED ORDER — MIDAZOLAM HCL 2 MG/2ML IJ SOLN
INTRAMUSCULAR | Status: DC | PRN
  Administered 2017-07-26: 1 mg via INTRAVENOUS

## 2017-07-26 MED ORDER — SODIUM CHLORIDE 0.9 % WEIGHT BASED INFUSION
3.0000 mL/kg/h | INTRAVENOUS | Status: AC
  Administered 2017-07-26: 3 mL/kg/h via INTRAVENOUS

## 2017-07-26 SURGICAL SUPPLY — 9 items
CATH INFINITI 5 FR RCB (CATHETERS) ×1 IMPLANT
CATH INFINITI 5FR MULTPACK ANG (CATHETERS) ×1 IMPLANT
KIT HEART LEFT (KITS) ×2 IMPLANT
PACK CARDIAC CATHETERIZATION (CUSTOM PROCEDURE TRAY) ×2 IMPLANT
SHEATH PINNACLE 5F 10CM (SHEATH) ×1 IMPLANT
SYR MEDRAD MARK V 150ML (SYRINGE) ×2 IMPLANT
TRANSDUCER W/STOPCOCK (MISCELLANEOUS) ×2 IMPLANT
TUBING CIL FLEX 10 FLL-RA (TUBING) ×2 IMPLANT
WIRE EMERALD 3MM-J .035X150CM (WIRE) ×1 IMPLANT

## 2017-07-26 NOTE — Progress Notes (Signed)
Site area:  Rt fa sheath Site Prior to Removal:  Level 0 Pressure Applied For: 20 minutes Manual:   yes Patient Status During Pull:  stable Post Pull Site:  Level  0 Post Pull Instructions Given:  yes Post Pull Pulses Present: palpable Dressing Applied:  Gauze and tegaderm Bedrest begins @ 0955 Comments:

## 2017-07-26 NOTE — Discharge Instructions (Signed)
**Note -identified via Obfuscation** °Moderate Conscious Sedation, Adult, Care After °These instructions provide you with information about caring for yourself after your procedure. Your health care provider may also give you more specific instructions. Your treatment has been planned according to current medical practices, but problems sometimes occur. Call your health care provider if you have any problems or questions after your procedure. °What can I expect after the procedure? °After your procedure, it is common: °· To feel sleepy for several hours. °· To feel clumsy and have poor balance for several hours. °· To have poor judgment for several hours. °· To vomit if you eat too soon. ° °Follow these instructions at home: °For at least 24 hours after the procedure: ° °· Do not: °? Participate in activities where you could fall or become injured. °? Drive. °? Use heavy machinery. °? Drink alcohol. °? Take sleeping pills or medicines that cause drowsiness. °? Make important decisions or sign legal documents. °? Take care of children on your own. °· Rest. °Eating and drinking °· Follow the diet recommended by your health care provider. °· If you vomit: °? Drink water, juice, or soup when you can drink without vomiting. °? Make sure you have little or no nausea before eating solid foods. °General instructions °· Have a responsible adult stay with you until you are awake and alert. °· Take over-the-counter and prescription medicines only as told by your health care provider. °· If you smoke, do not smoke without supervision. °· Keep all follow-up visits as told by your health care provider. This is important. °Contact a health care provider if: °· You keep feeling nauseous or you keep vomiting. °· You feel light-headed. °· You develop a rash. °· You have a fever. °Get help right away if: °· You have trouble breathing. °This information is not intended to replace advice given to you by your health care provider. Make sure you discuss any questions you  have with your health care provider. °Document Released: 04/10/2013 Document Revised: 11/23/2015 Document Reviewed: 10/10/2015 °Elsevier Interactive Patient Education © 2018 Elsevier Inc[employer] ° ° ° °Femoral Site Care °Refer to this sheet in the next few weeks. These instructions provide you with information about caring for yourself after your procedure. Your health care provider may also give you more specific instructions. Your treatment has been planned according to current medical practices, but problems sometimes occur. Call your health care provider if you have any problems or questions after your procedure. °What can I expect after the procedure? °After your procedure, it is typical to have the following: °· Bruising at the site that usually fades within 1-2 weeks. °· Blood collecting in the tissue (hematoma) that may be painful to the touch. It should usually decrease in size and tenderness within 1-2 weeks. ° °Follow these instructions at home: °· Take medicines only as directed by your health care provider. °· You may shower 24-48 hours after the procedure or as directed by your health care provider. Remove the bandage (dressing) and gently wash the site with plain soap and water. Pat the area dry with a clean towel. Do not rub the site, because this may cause bleeding. °· Do not take baths, swim, or use a hot tub until your health care provider approves. °· Check your insertion site every day for redness, swelling, or drainage. °· Do not apply powder or lotion to the site. °· Limit use of stairs to twice a day for the first 2-3 days or as directed by your health care  **Note -identified via Obfuscation** provider. °· Do not squat for the first 2-3 days or as directed by your health care provider. °· Do not lift over 10 lb (4.5 kg) for 5 days after your procedure or as directed by your health care provider. °· Ask your health care provider when it is okay to: °? Return to work or school. °? Resume usual physical activities or sports. °? Resume  sexual activity. °· Do not drive home if you are discharged the same day as the procedure. Have someone else drive you. °· You may drive 24 hours after the procedure unless otherwise instructed by your health care provider. °· Do not operate machinery or power tools for 24 hours after the procedure or as directed by your health care provider. °· If your procedure was done as an outpatient procedure, which means that you went home the same day as your procedure, a responsible adult should be with you for the first 24 hours after you arrive home. °· Keep all follow-up visits as directed by your health care provider. This is important. °Contact a health care provider if: °· You have a fever. °· You have chills. °· You have increased bleeding from the site. Hold pressure on the site. °Get help right away if: °· You have unusual pain at the site. °· You have redness, warmth, or swelling at the site. °· You have drainage (other than a small amount of blood on the dressing) from the site. °· The site is bleeding, and the bleeding does not stop after 30 minutes of holding steady pressure on the site. °· Your leg or foot becomes pale, cool, tingly, or numb. °This information is not intended to replace advice given to you by your health care provider. Make sure you discuss any questions you have with your health care provider. °Document Released: 02/21/2014 Document Revised: 11/26/2015 Document Reviewed: 01/07/2014 °Elsevier Interactive Patient Education © 2018 Elsevier Inc. ° °

## 2017-07-26 NOTE — Progress Notes (Signed)
Assumed care of pt. Assessment unchanged & documented. Discharge instructions reviewed with pt and spouse.

## 2017-07-26 NOTE — Interval H&P Note (Signed)
History and Physical Interval Note:  07/26/2017 8:05 AM  James Armstrong  has presented today for cardiac cath with the diagnosis of chest pain  The various methods of treatment have been discussed with the patient and family. After consideration of risks, benefits and other options for treatment, the patient has consented to  Procedure(s): LEFT HEART CATH AND CORS/GRAFTS ANGIOGRAPHY (N/A) as a surgical intervention .  The patient's history has been reviewed, patient examined, no change in status, stable for surgery.  I have reviewed the patient's chart and labs.  Questions were answered to the patient's satisfaction.    Cath Lab Visit (complete for each Cath Lab visit)  Clinical Evaluation Leading to the Procedure:   ACS: No.  Non-ACS:    Anginal Classification: CCS III  Anti-ischemic medical therapy: Minimal Therapy (1 class of medications)  Non-Invasive Test Results: No non-invasive testing performed  Prior CABG: Previous CABG        Verne Carrow

## 2017-07-31 ENCOUNTER — Ambulatory Visit (INDEPENDENT_AMBULATORY_CARE_PROVIDER_SITE_OTHER): Payer: Medicare Other | Admitting: Cardiology

## 2017-07-31 ENCOUNTER — Encounter: Payer: Self-pay | Admitting: Cardiology

## 2017-07-31 VITALS — BP 140/80 | HR 71 | Ht 66.0 in | Wt 159.0 lb

## 2017-07-31 DIAGNOSIS — I25110 Atherosclerotic heart disease of native coronary artery with unstable angina pectoris: Secondary | ICD-10-CM

## 2017-07-31 MED ORDER — ISOSORBIDE MONONITRATE ER 30 MG PO TB24: 30.0000 mg | ORAL_TABLET | Freq: Every day | ORAL | 3 refills | Status: DC

## 2017-07-31 NOTE — Patient Instructions (Signed)
Medication Instructions:  Your physician has recommended you make the following change in your medication:  START isosorbide (Imdur) 30 mg daily  Labwork: None  Testing/Procedures: None  Follow-Up: Your physician wants you to follow-up in: 6 months. You will receive a reminder letter in the mail two months in advance. If you don't receive a letter, please call our office to schedule the follow-up appointment.  Any Other Special Instructions Will Be Listed Below (If Applicable).     If you need a refill on your cardiac medications before your next appointment, please call your pharmacy.

## 2017-07-31 NOTE — Progress Notes (Signed)
Cardiology Office Note:    Date:  07/31/2017   ID:  James Armstrong, DOB Jul 28, 1940, MRN [id number]  PCP:  Paulina Fusi, MD  Cardiologist:  Norman Herrlich, MD    Referring MD: Paulina Fusi, MD    ASSESSMENT:    1. Coronary artery disease involving native coronary artery of native heart with unstable angina pectoris (HCC)    PLAN:    In order of problems listed above:  1. Continue medical treatment previously unable to tolerate ranolazine will add oral nitrates although at this time he is having no angina.   Next appointment: 6 months   Medication Adjustments/Labs and Tests Ordered: Current medicines are reviewed at length with the patient today.  Concerns regarding medicines are outlined above.  No orders of the defined types were placed in this encounter.  No orders of the defined types were placed in this encounter.   Chief Complaint  Patient presents with  . Follow-up  . Coronary Artery Disease    recent heart cath    History of Present Illness:    James Armstrong is a 77 y.o. male with a hx of CAD with PCI / Resolute Drug Eluting Stent of the distal Left Main/Ostial LCF 08/09/16 for troponin normal unstable angina, Dyslipidemia, HTN, S/P CABG in 2003, CVA, with CEA and a small abdominal aneurysm last seen 2 weeks ago.. Compliance with diet, lifestyle and medications: Yes He is feeling quite well has no exercise intolerance chest pain dyspnea palpitation or syncope.  I reviewed the results of his coronary angiogram and he is comfortable with medical treatment.  He has been intolerant of ranolazine and will add oral nitrates to his beta-blocker. Past Medical History:  Diagnosis Date  . Abdominal aortic aneurysm (AAA) 30 to 34 mm in diameter (HCC)   . Abdominal aortic aneurysm (HCC) 02/24/2016  . Asthma   . Chest pain 02/22/2017  . Coronary artery disease involving native coronary artery of native heart with angina pectoris (HCC) 02/24/2016   CABG in 2004   coronary angiography done 08/09/16 Abnormal Diagnostic Summary Chronic Total Occlusion of the RCA Severe stenosis of the LM, LAD, Circumflex Patent LIMA graft to the mid LAD. Patent SVG graft to the Ramus coronary artery. Moderate disease of SVG graft to the Posterior descending coronary artery. LV not done due to tortuosity of right subclavian Interventional Summary Successful PCI / Resolute Drug Eluting Stent of the distal Left Main/Ostial Circumflex Coronary Artery.      . Crohn disease (HCC)   . Essential hypertension 02/24/2016  . GERD (gastroesophageal reflux disease)   . Hx of CABG 02/24/2016   Overview:  In 2003  . Hyperlipemia 02/24/2016  . Shortness of breath 08/06/2016  . Sinus bradycardia 08/06/2016  . Stroke [facility])     Past Surgical History:  Procedure Laterality Date  . CARDIAC CATHETERIZATION    . CAROTID ENDARTERECTOMY    . CHOLECYSTECTOMY    . CORONARY ARTERY BYPASS GRAFT     2004  . HERNIA REPAIR    . LEFT HEART CATH AND CORS/GRAFTS ANGIOGRAPHY N/A 07/26/2017   Procedure: LEFT HEART CATH AND CORS/GRAFTS ANGIOGRAPHY;  Surgeon: Kathleene Hazel, MD;  Location: [facility] INVASIVE CV LAB;  Service: Cardiovascular;  Laterality: N/A;    Current Medications: Current Meds  Medication Sig  . Ascorbic Acid (VITAMIN C PO) Take 1 tablet by mouth daily.  Marland Kitchen aspirin EC 81 MG tablet Take 81 mg by mouth daily.  . Cholecalciferol (VITAMIN D3 PO)  Take 1 capsule by mouth daily.  . clopidogrel (PLAVIX) 75 MG tablet Take 75 mg by mouth daily.  . fenofibrate 160 MG tablet Take 160 mg by mouth daily.  Marland Kitchen ibuprofen (ADVIL,MOTRIN) 200 MG tablet Take 400 mg by mouth every 6 (six) hours as needed for headache or moderate pain.  . metoprolol tartrate (LOPRESSOR) 50 MG tablet Take 25 mg by mouth 2 (two) times daily.  . Multiple Vitamins-Minerals (MULTIVITAMIN PO) Take 1 tablet by mouth daily.  . nitroGLYCERIN (NITROSTAT) 0.4 MG SL tablet Place 1 tablet (0.4 mg total) under the tongue every 5 (five)  minutes as needed for chest pain.  Marland Kitchen omeprazole (PRILOSEC) 20 MG capsule Take 40 mg by mouth at bedtime.  . rosuvastatin (CRESTOR) 40 MG tablet Take 40 mg by mouth daily.     Allergies:   Ranexa [ranolazine]   Social History   Socioeconomic History  . Marital status: Single    Spouse name: None  . Number of children: None  . Years of education: None  . Highest education level: None  Social Needs  . Financial resource strain: None  . Food insecurity - worry: None  . Food insecurity - inability: None  . Transportation needs - medical: None  . Transportation needs - non-medical: None  Occupational History  . None  Tobacco Use  . Smoking status: Never Smoker  . Smokeless tobacco: Never Used  Substance and Sexual Activity  . Alcohol use: No  . Drug use: No  . Sexual activity: None  Other Topics Concern  . None  Social History Narrative  . None     Family History: The patient's family history includes Diabetes in his brother; Heart attack in his brother; Stroke in his mother. ROS:   Please see the history of present illness.    All other systems reviewed and are negative.  EKGs/Labs/Other Studies Reviewed:    The following studies were reviewed today:  Left heart cath 07/26/17: Conclusion    Mid RCA lesion is 100% stenosed.  SVG graft was visualized by angiography and is normal in caliber.  Mid Graft lesion is 50% stenosed.  Ost Cx lesion is 10% stenosed.  Ost LM to Ost LAD lesion is 60% stenosed.  Prox LAD lesion is 100% stenosed.  Prox RCA to Mid RCA lesion is 50% stenosed.  The left ventricular systolic function is normal.  LV end diastolic pressure is normal.  The left ventricular ejection fraction is greater than 65% by visual estimate.  There is no mitral valve regurgitation. 1. Severe triple vessel CAD s/p 3V CABG with 3/3 patent bypass grafts 2. The LAD is occluded in the mid segment. The mid and distal vessel fills from the patent LIMA graft.   3. The left main has a stented segment leading into the Circumflex. The stent is not well expanded and per cath report in February 2018 at [facility], the stent was never well expanded at time of deployment despite repeated attempts. The Circumflex is patent beyond the stent. The intermediate branch is patent. The graft to the intermediate branch is patent and fills the intermediate branch and the entire Circumflex/OM system. While the left main stent is not well expanded, all vessels downstream from this are supplied by the graft to the intermediate system.  4. The RCA is chronically occluded in the mid segment. The vein graft to the distal RCA is patent. There is moderate disease in the mid body of the vein graft. This lesion does not  appear to be flow limiting.  5. Normal LV systolic function.  Recommendations: I have reviewed his films this am with Dr. [place]. We both recommend continued medical management of his CAD. The left main stent is not well expanded but the intermediate branch and Circumflex fill from the patent graft. The disease in the vein graft to the RCA does not appear to be flow limiting.     Recent Labs: 07/17/2017: BUN 18; Creatinine, Ser 1.06; Hemoglobin 13.1; Platelets 256; Potassium 4.2; Sodium 144  Recent Lipid Panel No results found for: CHOL, TRIG, HDL, CHOLHDL, VLDL, LDLCALC, LDLDIRECT  Physical Exam:    VS:  BP 140/80 (BP Location: Right Arm, Patient Position: Sitting, Cuff Size: Normal)   Pulse 71   Ht 5\' 6"  (1.676 m)   Wt 159 lb (72.1 kg)   SpO2 97%   BMI 25.66 kg/m     Wt Readings from Last 3 Encounters:  07/31/17 159 lb (72.1 kg)  07/26/17 158 lb (71.7 kg)  07/17/17 160 lb (72.6 kg)     GEN:  Well nourished, well developed in no acute distress HEENT: Normal NECK: No JVD; No carotid bruits LYMPHATICS: No lymphadenopathy CARDIAC: RRR, no murmurs, rubs, gallops RESPIRATORY:  Clear to auscultation without rales, wheezing or rhonchi  ABDOMEN: Soft,  non-tender, non-distended MUSCULOSKELETAL:  No edema; No deformity  SKIN: Warm and dry NEUROLOGIC:  Alert and oriented x 3 PSYCHIATRIC:  Normal affect    Signed, Norman Herrlich, MD  07/31/2017 3:42 PM    [facility]

## 2017-08-18 DIAGNOSIS — E785 Hyperlipidemia, unspecified: Secondary | ICD-10-CM | POA: Diagnosis not present

## 2017-08-18 DIAGNOSIS — I10 Essential (primary) hypertension: Secondary | ICD-10-CM | POA: Diagnosis not present

## 2017-08-18 DIAGNOSIS — Z6825 Body mass index (BMI) 25.0-25.9, adult: Secondary | ICD-10-CM | POA: Diagnosis not present

## 2017-08-18 DIAGNOSIS — Z79899 Other long term (current) drug therapy: Secondary | ICD-10-CM | POA: Diagnosis not present

## 2017-08-18 DIAGNOSIS — Z125 Encounter for screening for malignant neoplasm of prostate: Secondary | ICD-10-CM | POA: Diagnosis not present

## 2017-08-18 DIAGNOSIS — I2510 Atherosclerotic heart disease of native coronary artery without angina pectoris: Secondary | ICD-10-CM | POA: Diagnosis not present

## 2017-10-23 DIAGNOSIS — J208 Acute bronchitis due to other specified organisms: Secondary | ICD-10-CM | POA: Diagnosis not present

## 2017-11-01 DIAGNOSIS — E785 Hyperlipidemia, unspecified: Secondary | ICD-10-CM | POA: Diagnosis not present

## 2017-11-01 DIAGNOSIS — I10 Essential (primary) hypertension: Secondary | ICD-10-CM | POA: Diagnosis not present

## 2017-11-01 DIAGNOSIS — H6123 Impacted cerumen, bilateral: Secondary | ICD-10-CM | POA: Diagnosis not present

## 2017-11-01 DIAGNOSIS — I2510 Atherosclerotic heart disease of native coronary artery without angina pectoris: Secondary | ICD-10-CM | POA: Diagnosis not present

## 2017-11-18 ENCOUNTER — Other Ambulatory Visit: Payer: Self-pay | Admitting: Cardiology

## 2017-11-18 DIAGNOSIS — I25110 Atherosclerotic heart disease of native coronary artery with unstable angina pectoris: Secondary | ICD-10-CM

## 2018-01-03 DIAGNOSIS — I25810 Atherosclerosis of coronary artery bypass graft(s) without angina pectoris: Secondary | ICD-10-CM | POA: Diagnosis not present

## 2018-01-03 DIAGNOSIS — Z7982 Long term (current) use of aspirin: Secondary | ICD-10-CM | POA: Diagnosis not present

## 2018-01-03 DIAGNOSIS — R0602 Shortness of breath: Secondary | ICD-10-CM | POA: Diagnosis not present

## 2018-01-03 DIAGNOSIS — I2510 Atherosclerotic heart disease of native coronary artery without angina pectoris: Secondary | ICD-10-CM | POA: Diagnosis not present

## 2018-01-03 DIAGNOSIS — I252 Old myocardial infarction: Secondary | ICD-10-CM | POA: Diagnosis not present

## 2018-01-03 DIAGNOSIS — K219 Gastro-esophageal reflux disease without esophagitis: Secondary | ICD-10-CM | POA: Diagnosis not present

## 2018-01-03 DIAGNOSIS — Z8673 Personal history of transient ischemic attack (TIA), and cerebral infarction without residual deficits: Secondary | ICD-10-CM | POA: Diagnosis not present

## 2018-01-03 DIAGNOSIS — I10 Essential (primary) hypertension: Secondary | ICD-10-CM | POA: Diagnosis not present

## 2018-01-03 DIAGNOSIS — E7800 Pure hypercholesterolemia, unspecified: Secondary | ICD-10-CM | POA: Diagnosis not present

## 2018-01-03 DIAGNOSIS — Z79899 Other long term (current) drug therapy: Secondary | ICD-10-CM | POA: Diagnosis not present

## 2018-01-03 DIAGNOSIS — R079 Chest pain, unspecified: Secondary | ICD-10-CM | POA: Diagnosis not present

## 2018-01-03 DIAGNOSIS — I259 Chronic ischemic heart disease, unspecified: Secondary | ICD-10-CM | POA: Diagnosis not present

## 2018-01-03 DIAGNOSIS — Z955 Presence of coronary angioplasty implant and graft: Secondary | ICD-10-CM | POA: Diagnosis not present

## 2018-01-04 DIAGNOSIS — I2510 Atherosclerotic heart disease of native coronary artery without angina pectoris: Secondary | ICD-10-CM | POA: Diagnosis not present

## 2018-01-04 DIAGNOSIS — R079 Chest pain, unspecified: Secondary | ICD-10-CM | POA: Diagnosis not present

## 2018-01-10 DIAGNOSIS — Z9181 History of falling: Secondary | ICD-10-CM | POA: Diagnosis not present

## 2018-01-10 DIAGNOSIS — I2510 Atherosclerotic heart disease of native coronary artery without angina pectoris: Secondary | ICD-10-CM | POA: Diagnosis not present

## 2018-01-10 DIAGNOSIS — F419 Anxiety disorder, unspecified: Secondary | ICD-10-CM | POA: Diagnosis not present

## 2018-01-10 DIAGNOSIS — Z1331 Encounter for screening for depression: Secondary | ICD-10-CM | POA: Diagnosis not present

## 2018-01-31 NOTE — Progress Notes (Signed)
Cardiology Office Note:    Date:  02/01/2018   ID:  James Armstrong, DOB [birth year], MRN [id number]  PCP:  Paulina Fusi, MD  Cardiologist:  Norman Herrlich, MD    Referring MD: Paulina Fusi, MD    ASSESSMENT:    1. Coronary artery disease involving native coronary artery of native heart with unstable angina pectoris (HCC)   2. Essential hypertension   3. Abdominal aortic aneurysm (AAA) without rupture (HCC)   4. Hyperlipidemia, unspecified hyperlipidemia type   5. Myopathy    PLAN:    In order of problems listed above:  1. Stable continue current medical treatment, with his bradycardia will reduce his beta-blocker dose by 50% 2. Stable continue current treatment 3. Will address at his next visit and schedule follow-up ultrasound of abdominal aorta for small aneurysm stable 4. Well-controlled on a statin I think he is having muscle toxicity will withdraw and statin and reassess in the office and if that is the case I will put him on a low intensity statin and if necessary we could add PC SK 9 to achieve goal 5. Discontinue statin and reassess in 1 month 6. Sinus bradycardia, reduce beta-blocker check home heart rate and blood pressure and record   Next appointment: 3 months   Medication Adjustments/Labs and Tests Ordered: Current medicines are reviewed at length with the patient today.  Concerns regarding medicines are outlined above.  No orders of the defined types were placed in this encounter.  Meds ordered this encounter  Medications  . metoprolol tartrate (LOPRESSOR) 50 MG tablet    Sig: Take 0.5 tablets (25 mg total) by mouth daily.    Dispense:  60 tablet    Refill:  3    Chief Complaint  Patient presents with  . Shortness of Breath  . Dizziness    History of Present Illness:    James Armstrong is a 77 y.o. male with a hx of  CAD with CABG 2004 and  PCI / Resolute Drug Eluting Stent of the distal Left Main/Ostial LCF 08/09/16 for troponin normal  unstable angina, Dyslipidemia, HTN, S/P CABG in 2003, CVA, with CEA and a small abdominal aneurysm   last seen 07/31/17. Compliance with diet, lifestyle and medications: yes  He has had no anginal discomfort but complains of fatigue and exercise intolerance he is bradycardic at rest and also complains of muscle weakness proximal trouble climbing stairs and takes a high intensity statin.  No edema orthopnea syncope TIA Past Medical History:  Diagnosis Date  . Abdominal aortic aneurysm (AAA) 30 to 34 mm in diameter (HCC)   . Abdominal aortic aneurysm (HCC) 02/24/2016  . Asthma   . Chest pain 02/22/2017  . Coronary artery disease involving native coronary artery of native heart with angina pectoris (HCC) 02/24/2016   CABG in 2004  coronary angiography done 08/09/16 Abnormal Diagnostic Summary Chronic Total Occlusion of the RCA Severe stenosis of the LM, LAD, Circumflex Patent LIMA graft to the mid LAD. Patent SVG graft to the Ramus coronary artery. Moderate disease of SVG graft to the Posterior descending coronary artery. LV not done due to tortuosity of right subclavian Interventional Summary Successful PCI / Resolute Drug Eluting Stent of the distal Left Main/Ostial Circumflex Coronary Artery.      . Crohn disease (HCC)   . Essential hypertension 02/24/2016  . GERD (gastroesophageal reflux disease)   . Hx of CABG 02/24/2016   Overview:  In 2003  . Hyperlipemia 02/24/2016  .  Shortness of breath 08/06/2016  . Sinus bradycardia 08/06/2016  . Stroke [facility])     Past Surgical History:  Procedure Laterality Date  . CARDIAC CATHETERIZATION    . CAROTID ENDARTERECTOMY    . CHOLECYSTECTOMY    . CORONARY ARTERY BYPASS GRAFT     2004  . HERNIA REPAIR    . LEFT HEART CATH AND CORS/GRAFTS ANGIOGRAPHY N/A 07/26/2017   Procedure: LEFT HEART CATH AND CORS/GRAFTS ANGIOGRAPHY;  Surgeon: Kathleene Hazel, MD;  Location: [facility] INVASIVE CV LAB;  Service: Cardiovascular;  Laterality: N/A;    Current  Medications: Current Meds  Medication Sig  . aspirin EC 81 MG tablet Take 81 mg by mouth daily.  . Cholecalciferol (VITAMIN D3 PO) Take 1 capsule by mouth daily.  . clopidogrel (PLAVIX) 75 MG tablet Take 75 mg by mouth daily.  Marland Kitchen escitalopram (LEXAPRO) 10 MG tablet Take 10 mg by mouth daily.  . fenofibrate 160 MG tablet Take 160 mg by mouth daily.  Marland Kitchen ibuprofen (ADVIL,MOTRIN) 200 MG tablet Take 400 mg by mouth every 6 (six) hours as needed for headache or moderate pain.  . isosorbide mononitrate (IMDUR) 30 MG 24 hr tablet TAKE 1 TABLET BY MOUTH ONCE DAILY  . metoprolol tartrate (LOPRESSOR) 50 MG tablet Take 0.5 tablets (25 mg total) by mouth daily.  . Multiple Vitamins-Minerals (MULTIVITAMIN PO) Take 1 tablet by mouth daily.  . nitroGLYCERIN (NITROSTAT) 0.4 MG SL tablet Place 1 tablet (0.4 mg total) under the tongue every 5 (five) minutes as needed for chest pain.  Marland Kitchen omeprazole (PRILOSEC) 20 MG capsule Take 40 mg by mouth at bedtime.  . [DISCONTINUED] Ascorbic Acid (VITAMIN C PO) Take 1 tablet by mouth daily.  . [DISCONTINUED] metoprolol tartrate (LOPRESSOR) 50 MG tablet Take 25 mg by mouth 2 (two) times daily.  . [DISCONTINUED] rosuvastatin (CRESTOR) 40 MG tablet Take 40 mg by mouth daily.     Allergies:   Ranexa [ranolazine]   Social History   Socioeconomic History  . Marital status: Single    Spouse name: Not on file  . Number of children: Not on file  . Years of education: Not on file  . Highest education level: Not on file  Occupational History  . Not on file  Social Needs  . Financial resource strain: Not on file  . Food insecurity:    Worry: Not on file    Inability: Not on file  . Transportation needs:    Medical: Not on file    Non-medical: Not on file  Tobacco Use  . Smoking status: Never Smoker  . Smokeless tobacco: Never Used  Substance and Sexual Activity  . Alcohol use: No  . Drug use: No  . Sexual activity: Not on file  Lifestyle  . Physical activity:     Days per week: Not on file    Minutes per session: Not on file  . Stress: Not on file  Relationships  . Social connections:    Talks on phone: Not on file    Gets together: Not on file    Attends religious service: Not on file    Active member of club or organization: Not on file    Attends meetings of clubs or organizations: Not on file    Relationship status: Not on file  Other Topics Concern  . Not on file  Social History Narrative  . Not on file     Family History: The patient's family history includes Diabetes in his brother; Heart attack  in his brother; Stroke in his mother. ROS:   Please see the history of present illness.    All other systems reviewed and are negative.  EKGs/Labs/Other Studies Reviewed:    The following studies were reviewed today:  Recent requested from his PCP  Recent Labs: 07/17/2017: BUN 18; Creatinine, Ser 1.06; Hemoglobin 13.1; Platelets 256; Potassium 4.2; Sodium 144  Recent Lipid Panel No results found for: CHOL, TRIG, HDL, CHOLHDL, VLDL, LDLCALC, LDLDIRECT  Physical Exam:    VS:  BP 120/64 (BP Location: Right Arm, Patient Position: Sitting, Cuff Size: Normal)   Pulse (!) 49   Ht 5\' 6"  (1.676 m)   Wt 158 lb (71.7 kg)   SpO2 97%   BMI 25.50 kg/m     Wt Readings from Last 3 Encounters:  02/01/18 158 lb (71.7 kg)  07/31/17 159 lb (72.1 kg)  07/26/17 158 lb (71.7 kg)     GEN:  Well nourished, well developed in no acute distress HEENT: Normal NECK: No JVD; No carotid bruits LYMPHATICS: No lymphadenopathy CARDIAC: RRR, no murmurs, rubs, gallops RESPIRATORY:  Clear to auscultation without rales, wheezing or rhonchi  ABDOMEN: Soft, non-tender, non-distended MUSCULOSKELETAL:  No edema; No deformity  SKIN: Warm and dry NEUROLOGIC:  Alert and oriented x 3 PSYCHIATRIC:  Normal affect    Signed, Norman Herrlich, MD  02/01/2018 10:51 AM    [facility]

## 2018-02-01 ENCOUNTER — Encounter: Payer: Self-pay | Admitting: Cardiology

## 2018-02-01 ENCOUNTER — Ambulatory Visit (INDEPENDENT_AMBULATORY_CARE_PROVIDER_SITE_OTHER): Payer: Medicare Other | Admitting: Cardiology

## 2018-02-01 VITALS — BP 120/64 | HR 49 | Ht 66.0 in | Wt 158.0 lb

## 2018-02-01 DIAGNOSIS — I714 Abdominal aortic aneurysm, without rupture, unspecified: Secondary | ICD-10-CM

## 2018-02-01 DIAGNOSIS — G729 Myopathy, unspecified: Secondary | ICD-10-CM

## 2018-02-01 DIAGNOSIS — I10 Essential (primary) hypertension: Secondary | ICD-10-CM | POA: Diagnosis not present

## 2018-02-01 DIAGNOSIS — E785 Hyperlipidemia, unspecified: Secondary | ICD-10-CM

## 2018-02-01 DIAGNOSIS — I25110 Atherosclerotic heart disease of native coronary artery with unstable angina pectoris: Secondary | ICD-10-CM | POA: Diagnosis not present

## 2018-02-01 HISTORY — DX: Myopathy, unspecified: G72.9

## 2018-02-01 MED ORDER — METOPROLOL TARTRATE 50 MG PO TABS: 25.0000 mg | ORAL_TABLET | Freq: Every day | ORAL | 3 refills | Status: AC

## 2018-02-01 NOTE — Patient Instructions (Signed)
Medication Instructions:  Your physician has recommended you make the following change in your medication:  STOP crestor DECREASE metoprolol to 25 mg daily  Labwork: None  Testing/Procedures: None  Follow-Up: Your physician recommends that you schedule a follow-up appointment in: 4 weeks  Any Other Special Instructions Will Be Listed Below (If Applicable).     If you need a refill on your cardiac medications before your next appointment, please call your pharmacy.   [facility]  Garey Ham, RN, BSN

## 2018-02-20 DIAGNOSIS — E785 Hyperlipidemia, unspecified: Secondary | ICD-10-CM | POA: Diagnosis not present

## 2018-02-20 DIAGNOSIS — Z1339 Encounter for screening examination for other mental health and behavioral disorders: Secondary | ICD-10-CM | POA: Diagnosis not present

## 2018-02-20 DIAGNOSIS — Z79899 Other long term (current) drug therapy: Secondary | ICD-10-CM | POA: Diagnosis not present

## 2018-02-20 DIAGNOSIS — I10 Essential (primary) hypertension: Secondary | ICD-10-CM | POA: Diagnosis not present

## 2018-02-20 DIAGNOSIS — I2510 Atherosclerotic heart disease of native coronary artery without angina pectoris: Secondary | ICD-10-CM | POA: Diagnosis not present

## 2018-02-22 ENCOUNTER — Telehealth: Payer: Self-pay | Admitting: Cardiology

## 2018-02-22 NOTE — Telephone Encounter (Signed)
His cholesterol and LDL are high/and wants to know if he should see BJM about it

## 2018-02-26 NOTE — Telephone Encounter (Signed)
Patient had called back and still not heard from [place] on response

## 2018-02-26 NOTE — Telephone Encounter (Signed)
Left message for patient to return call.

## 2018-02-27 NOTE — Telephone Encounter (Signed)
Spoke with patient and made him aware that Dr Dulce Sellar has taken a look at the labs briefly and doesn't feel the need for him to come in any sooner.  He will keep follow up appointment as planned, unless Dr Dulce Sellar reviews labs and chart and says otherwise.  Patient verbalized understanding.

## 2018-03-29 NOTE — Progress Notes (Signed)
Cardiology Office Note:    Date:  03/30/2018   ID:  QUANG THORPE, DOB Aug 20, 1940, MRN [id number]  PCP:  Paulina Fusi, MD  Cardiologist:  Norman Herrlich, MD    Referring MD: Paulina Fusi, MD    ASSESSMENT:    1. Coronary artery disease involving native coronary artery of native heart with unstable angina pectoris (HCC)   2. Pure hypercholesterolemia   3. Essential hypertension   4. Abdominal aortic aneurysm (AAA) without rupture (HCC)   5. Stenosis of carotid artery, unspecified laterality    PLAN:    In order of problems listed above:  1. CAD chronic stable New [place] Heart Association class I continue medical treatment at this time would not pursue an ischemia evaluation 2. Severe statin intolerance he takes fenofibrate for elevated triglycerides and I will refer him to lipid clinic to access PCSK9 check lipids 1 month after starting 3. Stable controlled continue current treatment beta-blocker 4. Recheck duplex he is overdue if greater than 5 cm he would benefit from vascular surgery evaluation for consideration EV AR 5. Asymptomatic at risk for progression recheck duplex of greater than 80% vascular surgery consultation   Next appointment: 6 months   Medication Adjustments/Labs and Tests Ordered: Current medicines are reviewed at length with the patient today.  Concerns regarding medicines are outlined above.  No orders of the defined types were placed in this encounter.  No orders of the defined types were placed in this encounter.   Chief Complaint  Patient presents with  . Follow-up  . Coronary Artery Disease    History of Present Illness:    James Armstrong is a 77 y.o. male with a hx of CAD with bypass surgery CABG 2004 and subsequent PCI resolute drug-eluting stent to distal left main ostial left circumflex 08/09/2016 per troponin normal unstable angina.  Other problems include dyslipidemia hypertension stroke with previous carotid endarterectomy  and small abdominal aortic aneurysm.  He was last seen 02/01/2018. Compliance with diet, lifestyle and medications: Yes  Unfortunately he is intolerant of all conventional lipid-lowering therapy we will try high medium low intensity statins and even tried minimal doses a few days a week and he cannot tolerate it also intolerant of Zetia.  He has CAD we reviewed the option of PCSK9 and I will start him on Repatha 140 mg every 2 weeks check a lipid profile 1 month after starting.  He has known carotid and abdominal aortic vascular disease overdue for follow-up last was in September 2017 carotid duplex and ultrasound of abdominal aorta ordered.  If he has high risk findings he benefit from seeing vascular surgery.  He has trouble rate gets weak when he plays golf of asked him to start to hydrate 16 ounces before and halfway through the golf course.  No exertional angina dyspnea palpitation or syncope. Past Medical History:  Diagnosis Date  . Abdominal aortic aneurysm (AAA) 30 to 34 mm in diameter (HCC)   . Abdominal aortic aneurysm (HCC) 02/24/2016  . Asthma   . Chest pain 02/22/2017  . Coronary artery disease involving native coronary artery of native heart with angina pectoris (HCC) 02/24/2016   CABG in 2004  coronary angiography done 08/09/16 Abnormal Diagnostic Summary Chronic Total Occlusion of the RCA Severe stenosis of the LM, LAD, Circumflex Patent LIMA graft to the mid LAD. Patent SVG graft to the Ramus coronary artery. Moderate disease of SVG graft to the Posterior descending coronary artery. LV not done due to tortuosity  of right subclavian Interventional Summary Successful PCI / Resolute Drug Eluting Stent of the distal Left Main/Ostial Circumflex Coronary Artery.      . Crohn disease (HCC)   . Essential hypertension 02/24/2016  . GERD (gastroesophageal reflux disease)   . Hx of CABG 02/24/2016   Overview:  In 2003  . Hyperlipemia 02/24/2016  . Shortness of breath 08/06/2016  . Sinus bradycardia  08/06/2016  . Stroke [facility])     Past Surgical History:  Procedure Laterality Date  . CARDIAC CATHETERIZATION    . CAROTID ENDARTERECTOMY    . CHOLECYSTECTOMY    . CORONARY ARTERY BYPASS GRAFT     2004  . HERNIA REPAIR    . LEFT HEART CATH AND CORS/GRAFTS ANGIOGRAPHY N/A 07/26/2017   Procedure: LEFT HEART CATH AND CORS/GRAFTS ANGIOGRAPHY;  Surgeon: Kathleene Hazel, MD;  Location: [facility] INVASIVE CV LAB;  Service: Cardiovascular;  Laterality: N/A;    Current Medications: Current Meds  Medication Sig  . aspirin EC 81 MG tablet Take 81 mg by mouth daily.  . Cholecalciferol (VITAMIN D3 PO) Take 1 capsule by mouth daily.  . clopidogrel (PLAVIX) 75 MG tablet Take 75 mg by mouth daily.  Marland Kitchen escitalopram (LEXAPRO) 10 MG tablet Take 10 mg by mouth daily.  . fenofibrate 160 MG tablet Take 160 mg by mouth daily.  Marland Kitchen ibuprofen (ADVIL,MOTRIN) 200 MG tablet Take 400 mg by mouth every 6 (six) hours as needed for headache or moderate pain.  . isosorbide mononitrate (IMDUR) 30 MG 24 hr tablet TAKE 1 TABLET BY MOUTH ONCE DAILY  . metoprolol tartrate (LOPRESSOR) 50 MG tablet Take 0.5 tablets (25 mg total) by mouth daily.  . Multiple Vitamins-Minerals (MULTIVITAMIN PO) Take 1 tablet by mouth daily.  . nitroGLYCERIN (NITROSTAT) 0.4 MG SL tablet Place 1 tablet (0.4 mg total) under the tongue every 5 (five) minutes as needed for chest pain.  Marland Kitchen omeprazole (PRILOSEC) 20 MG capsule Take 40 mg by mouth at bedtime.     Allergies:   Ranexa [ranolazine]   Social History   Socioeconomic History  . Marital status: Single    Spouse name: Not on file  . Number of children: Not on file  . Years of education: Not on file  . Highest education level: Not on file  Occupational History  . Not on file  Social Needs  . Financial resource strain: Not on file  . Food insecurity:    Worry: Not on file    Inability: Not on file  . Transportation needs:    Medical: Not on file    Non-medical: Not on file  Tobacco  Use  . Smoking status: Never Smoker  . Smokeless tobacco: Never Used  Substance and Sexual Activity  . Alcohol use: No  . Drug use: No  . Sexual activity: Not on file  Lifestyle  . Physical activity:    Days per week: Not on file    Minutes per session: Not on file  . Stress: Not on file  Relationships  . Social connections:    Talks on phone: Not on file    Gets together: Not on file    Attends religious service: Not on file    Active member of club or organization: Not on file    Attends meetings of clubs or organizations: Not on file    Relationship status: Not on file  Other Topics Concern  . Not on file  Social History Narrative  . Not on file  Family History: The patient's family history includes Diabetes in his brother; Heart attack in his brother; Stroke in his mother. ROS:   Please see the history of present illness.    All other systems reviewed and are negative.  EKGs/Labs/Other Studies Reviewed:    The following studies were reviewed today:  EKG:  EKG ordered today.  The ekg ordered today demonstrates sinus bradycardia normal  Recent Labs:   Recent labs show cholesterol 246 LDL 170 hemoglobin 13.3 creatinine normal potassium 4.6. 07/17/2017: BUN 18; Creatinine, Ser 1.06; Hemoglobin 13.1; Platelets 256; Potassium 4.2; Sodium 144  Recent Lipid Panel No results found for: CHOL, TRIG, HDL, CHOLHDL, VLDL, LDLCALC, LDLDIRECT  Physical Exam:    VS:  BP (!) 144/80 (BP Location: Right Arm, Patient Position: Sitting, Cuff Size: Normal)   Pulse (!) 56   Ht 5\' 6"  (1.676 m)   Wt 157 lb (71.2 kg)   SpO2 96%   BMI 25.34 kg/m     Wt Readings from Last 3 Encounters:  03/30/18 157 lb (71.2 kg)  02/01/18 158 lb (71.7 kg)  07/31/17 159 lb (72.1 kg)     GEN:  Well nourished, well developed in no acute distress HEENT: Normal NECK: No JVD; No carotid bruits LYMPHATICS: No lymphadenopathy CARDIAC: RRR, no murmurs, rubs, gallops RESPIRATORY:  Clear to auscultation  without rales, wheezing or rhonchi  ABDOMEN: Soft, non-tender, non-distended MUSCULOSKELETAL:  No edema; No deformity  SKIN: Warm and dry NEUROLOGIC:  Alert and oriented x 3 PSYCHIATRIC:  Normal affect    Signed, Norman Herrlich, MD  03/30/2018 9:51 AM    [facility]

## 2018-03-30 ENCOUNTER — Ambulatory Visit (INDEPENDENT_AMBULATORY_CARE_PROVIDER_SITE_OTHER): Payer: Medicare Other | Admitting: Cardiology

## 2018-03-30 ENCOUNTER — Encounter: Payer: Self-pay | Admitting: Cardiology

## 2018-03-30 VITALS — BP 144/80 | HR 56 | Ht 66.0 in | Wt 157.0 lb

## 2018-03-30 DIAGNOSIS — I714 Abdominal aortic aneurysm, without rupture, unspecified: Secondary | ICD-10-CM

## 2018-03-30 DIAGNOSIS — I25110 Atherosclerotic heart disease of native coronary artery with unstable angina pectoris: Secondary | ICD-10-CM

## 2018-03-30 DIAGNOSIS — I10 Essential (primary) hypertension: Secondary | ICD-10-CM | POA: Diagnosis not present

## 2018-03-30 DIAGNOSIS — I6529 Occlusion and stenosis of unspecified carotid artery: Secondary | ICD-10-CM | POA: Diagnosis not present

## 2018-03-30 DIAGNOSIS — E7800 Pure hypercholesterolemia, unspecified: Secondary | ICD-10-CM

## 2018-03-30 MED ORDER — EVOLOCUMAB 140 MG/ML ~~LOC~~ SOAJ: 140.0000 mg | SUBCUTANEOUS | 0 refills | Status: DC

## 2018-03-30 NOTE — Patient Instructions (Signed)
Medication Instructions:  Your physician has recommended you make the following change in your medication:   START Repatha 140mg /ml use every 2 weeks.  Someone from lipid clinic will be contacting you regarding education and usage.    Labwork: You will have lipids checked in 1 month  Testing/Procedures:  You had EKG today.  Your physician has requested that you have a carotid duplex. This test is an ultrasound of the carotid arteries in your neck. It looks at blood flow through these arteries that supply the brain with blood. Allow one hour for this exam. There are no restrictions or special instructions.  Your physician has requested that you have an abdominal aorta duplex. During this test, an ultrasound is used to evaluate the aorta. Allow 30 minutes for this exam. Do not eat after midnight the day before and avoid carbonated beverages   Follow-Up: Your physician wants you to follow-up in: 6 months.  You will receive a reminder letter in the mail two months in advance. If you don't receive a letter, please call our office to schedule the follow-up appointment.   Any Other Special Instructions Will Be Listed Below (If Applicable).     If you need a refill on your cardiac medications before your next appointment, please call your pharmacy.

## 2018-04-04 ENCOUNTER — Telehealth: Payer: Self-pay | Admitting: Cardiology

## 2018-04-04 NOTE — Telephone Encounter (Signed)
Please call patient, he ha not heard from the Lipid Clinic yet.Marland Kitchen

## 2018-04-04 NOTE — Telephone Encounter (Signed)
Staff message sent to Pharm D Megan Supple to contact patient regarding repatha initiation. Patient informed that he should here from them soon and if he does not to let us know. Patient verbalized understanding. No further questions.

## 2018-04-22 NOTE — Progress Notes (Signed)
Patient ID: James Armstrong                 DOB: 05/31/1941                    MRN: [id number]     HPI: James Armstrong is a 77 y.o. male patient referred to lipid clinic by Dr. Dulce Sellar. PMH is significant for CAD with CABG 2004 and PCI in 2018, carotid artery stenosis, stroke, HLD, HTN, and AAA. Recently admitted for [facility] on 07/26/17, revealed severe triple vessel CAD with mid LAD occluded, distal portions filling from graft; mid RCA chronically occluded, vein graft is patent with moderate disease in graft; left main with stent leading into circ, not fully deployed.   Patient has a history of intolerance to antihyperlipidemic medications. Patient was previously taking rosuvastatin 40 mg daily for the past 20 years.  He stopped taking it about two months ago due to low energy levels. He reports his energy greatly improved about a week after stopping it. Zetia was previously prescribed but was cost prohibitive, likely prescribed when it was still a branded medication.  Current Medications: fenofibrate 160 mg daily Intolerances: Rosuvastatin 40 daily (taking for 20 years, low energy, discontinued by MD, energy much improved about a week after stopping)   Risk Factors: triple vessel CAD with CABG (2004) and subsequent PCI (2018), high LDL off therapy, family history of premature ASCVD LDL goal: < 70  Diet: Living with daughter (who is an NP); daughter and grandsons cook for them Breakfast: egg and [place] sausage or cheerios with skim milk; Lunch: whatever is in fridge, leftovers, green beans; Dinner: breaded chicken, breaded fish, vegetables,  steak (once/month), Salad. Loves fruit (grapes, banana). Eats mostly at home.   Exercise: Previously golfed once/week, and worked outside. Was told he can't work outside or do a lot when it's hot because of his heart. Now, he tires too quickly. His daughter just bought a new rowing machine, he will start trying. Overall, energy levels have decreased since January.  Takes dogs on short walk ~4 times/day.  Family History: The patient's family history includes Diabetes in his brother; Heart attack in his brother (died at 16); Heart attack in other brother (died at 49); Stroke and heart attack (died at 72) in his mother.  Social History: (-) Tobacco Use, (-) Alcohol  Labs: 02/20/18: Tot 246, TG 185, HDL 39, LDL 170 (no medication) 08/19/17: Tot 159, TG 191, HDL 41, LDL 80 (rosuvastatin 40 mg daily) 02/16/18: Tot 145, TG 156, HDL 36, LDL 78 (rosuvastatin 40 mg daily) 08/19/16: Tot 132, TG 126, HDL 39, LDL 68 (rosuvastatin 40 mg daily)  Past Medical History:  Diagnosis Date  . Abdominal aortic aneurysm (AAA) 30 to 34 mm in diameter (HCC)   . Abdominal aortic aneurysm (HCC) 02/24/2016  . Asthma   . Chest pain 02/22/2017  . Coronary artery disease involving native coronary artery of native heart with angina pectoris (HCC) 02/24/2016   CABG in 2004  coronary angiography done 08/09/16 Abnormal Diagnostic Summary Chronic Total Occlusion of the RCA Severe stenosis of the LM, LAD, Circumflex Patent LIMA graft to the mid LAD. Patent SVG graft to the Ramus coronary artery. Moderate disease of SVG graft to the Posterior descending coronary artery. LV not done due to tortuosity of right subclavian Interventional Summary Successful PCI / Resolute Drug Eluting Stent of the distal Left Main/Ostial Circumflex Coronary Artery.      . Crohn disease (HCC)   .  Essential hypertension 02/24/2016  . GERD (gastroesophageal reflux disease)   . Hx of CABG 02/24/2016   Overview:  In 2003  . Hyperlipemia 02/24/2016  . Shortness of breath 08/06/2016  . Sinus bradycardia 08/06/2016  . Stroke [facility])     Current Outpatient Medications on File Prior to Visit  Medication Sig Dispense Refill  . aspirin EC 81 MG tablet Take 81 mg by mouth daily.    . Cholecalciferol (VITAMIN D3 PO) Take 1 capsule by mouth daily.    . clopidogrel (PLAVIX) 75 MG tablet Take 75 mg by mouth daily.    Marland Kitchen escitalopram  (LEXAPRO) 10 MG tablet Take 10 mg by mouth daily.  1  . Evolocumab (REPATHA SURECLICK) 140 MG/ML SOAJ Inject 140 mg into the skin every 14 (fourteen) days. 12 pen 0  . fenofibrate 160 MG tablet Take 160 mg by mouth daily.  1  . ibuprofen (ADVIL,MOTRIN) 200 MG tablet Take 400 mg by mouth every 6 (six) hours as needed for headache or moderate pain.    . isosorbide mononitrate (IMDUR) 30 MG 24 hr tablet TAKE 1 TABLET BY MOUTH ONCE DAILY 90 tablet 2  . metoprolol tartrate (LOPRESSOR) 50 MG tablet Take 0.5 tablets (25 mg total) by mouth daily. 60 tablet 3  . Multiple Vitamins-Minerals (MULTIVITAMIN PO) Take 1 tablet by mouth daily.    . nitroGLYCERIN (NITROSTAT) 0.4 MG SL tablet Place 1 tablet (0.4 mg total) under the tongue every 5 (five) minutes as needed for chest pain. 25 tablet 12  . omeprazole (PRILOSEC) 20 MG capsule Take 40 mg by mouth at bedtime.     No current facility-administered medications on file prior to visit.     Allergies  Allergen Reactions  . Ranexa [Ranolazine] Nausea Only    Assessment/Plan:  1. Hyperlipidemia - Pt with a significant cardiac history who would benefit from high-intensity statin therapy with LDL goal < 70. He previously was taking rosuvastatin 40 mg daily with lipids at and near goal. He recently stopped taking rosuvastatin due to low energy levels, reports his energy increased after stopping rosuvastatin. He has a relatively healthy diet eating mainly home-cooked meals, baked/air fried food, with vegetables and fruit. He admits to low levels of physical activity but is willing to increase activity now that it is Fall. At this point, patient would not qualify for Repatha having trialed only one high-intensity statin. Will start atorvastatin 40 mg daily. Clinic phone number provided for any questions or concerns with new medication. Will obtain fasting lipid panel at [facility] clinic in 2-3 months, will reach out to clinic to get labs scheduled. Follow up in lipid  clinic prn.   Marcelino Freestone, PharmD PGY2 Cardiology Pharmacy Resident 04/23/2018 3:11 PM

## 2018-04-23 ENCOUNTER — Ambulatory Visit (INDEPENDENT_AMBULATORY_CARE_PROVIDER_SITE_OTHER): Payer: Medicare Other | Admitting: Pharmacist

## 2018-04-23 DIAGNOSIS — E7800 Pure hypercholesterolemia, unspecified: Secondary | ICD-10-CM | POA: Diagnosis not present

## 2018-04-23 MED ORDER — ATORVASTATIN CALCIUM 40 MG PO TABS: 40.0000 mg | ORAL_TABLET | Freq: Every day | ORAL | 11 refills | Status: DC

## 2018-04-23 NOTE — Patient Instructions (Addendum)
It was nice to you meet you today.  Start taking atorvastatin 40 mg daily for your cholesterol. Continue with healthy diet, eating most meals at home and avoiding fried foods/red meat.  We will need to check FASTING labs in January to assess the effectiveness of the new medication.   Please do not hesitate to call us at the clinic at #[phone] if you have any adverse effects.

## 2018-04-24 ENCOUNTER — Telehealth: Payer: Self-pay | Admitting: Cardiology

## 2018-04-24 NOTE — Telephone Encounter (Signed)
Orders have already been placed.

## 2018-04-24 NOTE — Telephone Encounter (Signed)
Patient was seen by PharmD in the lipid clinic yesterday regarding repatha new start. It was determined that patient would not qualify for repatha at this time due to only trying one statin. Patient was started on atorvastatin (lipitor) 40 mg daily and advised to have a repeat lipid panel in 2-3 months in our [place] office. Patient reminded to return to our office in December or January, no appointment needed. Advised him to fast beforehand. Will make Dr. Dulce Sellar aware.

## 2018-04-24 NOTE — Telephone Encounter (Signed)
Patient went to Lipid Clinic yesterday and they are questioning maybe he go on another statin than the one that Dr. Dulce Sellar suggested. Please call him.

## 2018-04-24 NOTE — Telephone Encounter (Signed)
He should try atorvastatin

## 2018-05-16 ENCOUNTER — Ambulatory Visit (INDEPENDENT_AMBULATORY_CARE_PROVIDER_SITE_OTHER): Payer: Medicare Other

## 2018-05-16 DIAGNOSIS — I6529 Occlusion and stenosis of unspecified carotid artery: Secondary | ICD-10-CM

## 2018-05-16 DIAGNOSIS — I714 Abdominal aortic aneurysm, without rupture: Secondary | ICD-10-CM

## 2018-05-16 NOTE — Progress Notes (Signed)
Abdominal aortic duplex exam has been performed. The largest diameter was 3.4 cm seen in the distal segment.  Jimmy Burk Hoctor RDCS, RVT

## 2018-05-16 NOTE — Progress Notes (Addendum)
Complete carotid artery duplex exam has been performed. S/Pcarotid endarterectomy. ICA/ECA calcification, Bilateral ICA's are 40-59% stenosed.   Jimmy Mose Colaizzi RDCS, RVT

## 2018-05-17 ENCOUNTER — Telehealth: Payer: Self-pay

## 2018-05-17 DIAGNOSIS — I714 Abdominal aortic aneurysm, without rupture, unspecified: Secondary | ICD-10-CM

## 2018-05-17 DIAGNOSIS — I6529 Occlusion and stenosis of unspecified carotid artery: Secondary | ICD-10-CM

## 2018-05-17 NOTE — Telephone Encounter (Signed)
Left message for patient to return call for results of vascular testing.

## 2018-05-17 NOTE — Telephone Encounter (Signed)
Patient notified of vascular testing results.  Patient will recheck AAA in 6 months and carotid ultrasound in 1 year.  Patient agrees to plan and verbalized understanding.

## 2018-05-18 DIAGNOSIS — Z23 Encounter for immunization: Secondary | ICD-10-CM | POA: Diagnosis not present

## 2018-05-21 ENCOUNTER — Telehealth: Payer: Self-pay | Admitting: Pharmacist

## 2018-05-21 NOTE — Telephone Encounter (Signed)
Reached out to patient to see how he is tolerating atorvastatin 40 mg daily. Patient reports that he has no complaints "so far so good", no muscle pain or weakness reported with atorvastatin. He also reports that his energy level is slightly better than what it was on rosuvastatin. Per phone note with Mady Gemma, pt aware to go for fasting labs in December/January at [place] clinic. He voiced no questions. Follow up in lipid clinic prn.

## 2018-06-19 DIAGNOSIS — I25110 Atherosclerotic heart disease of native coronary artery with unstable angina pectoris: Secondary | ICD-10-CM | POA: Diagnosis not present

## 2018-06-19 DIAGNOSIS — E7800 Pure hypercholesterolemia, unspecified: Secondary | ICD-10-CM | POA: Diagnosis not present

## 2018-06-19 LAB — LIPID PANEL
CHOLESTEROL TOTAL: 173 mg/dL (ref 100–199)
Chol/HDL Ratio: 4.2 ratio (ref 0.0–5.0)
HDL: 41 mg/dL (ref >39)
LDL Calculated: 112 mg/dL — ABNORMAL HIGH (ref 0–99)
TRIGLYCERIDES: 102 mg/dL (ref 0–149)
VLDL Cholesterol Cal: 20 mg/dL (ref 5–40)

## 2018-08-18 ENCOUNTER — Other Ambulatory Visit: Payer: Self-pay | Admitting: Cardiology

## 2018-08-18 DIAGNOSIS — I25110 Atherosclerotic heart disease of native coronary artery with unstable angina pectoris: Secondary | ICD-10-CM

## 2018-08-24 DIAGNOSIS — Z125 Encounter for screening for malignant neoplasm of prostate: Secondary | ICD-10-CM | POA: Diagnosis not present

## 2018-08-24 DIAGNOSIS — I2510 Atherosclerotic heart disease of native coronary artery without angina pectoris: Secondary | ICD-10-CM | POA: Diagnosis not present

## 2018-08-24 DIAGNOSIS — E785 Hyperlipidemia, unspecified: Secondary | ICD-10-CM | POA: Diagnosis not present

## 2018-08-24 DIAGNOSIS — I10 Essential (primary) hypertension: Secondary | ICD-10-CM | POA: Diagnosis not present

## 2018-08-24 DIAGNOSIS — Z79899 Other long term (current) drug therapy: Secondary | ICD-10-CM | POA: Diagnosis not present

## 2018-09-25 DIAGNOSIS — I6523 Occlusion and stenosis of bilateral carotid arteries: Secondary | ICD-10-CM

## 2018-09-25 HISTORY — DX: Occlusion and stenosis of bilateral carotid arteries: I65.23

## 2018-09-26 ENCOUNTER — Telehealth (INDEPENDENT_AMBULATORY_CARE_PROVIDER_SITE_OTHER): Payer: Medicare Other | Admitting: Cardiology

## 2018-09-26 ENCOUNTER — Encounter: Payer: Self-pay | Admitting: Cardiology

## 2018-09-26 ENCOUNTER — Ambulatory Visit: Payer: Medicare Other | Admitting: Cardiology

## 2018-09-26 ENCOUNTER — Other Ambulatory Visit: Payer: Self-pay

## 2018-09-26 VITALS — BP 130/72 | HR 64 | Ht 66.0 in | Wt 150.6 lb

## 2018-09-26 DIAGNOSIS — E7800 Pure hypercholesterolemia, unspecified: Secondary | ICD-10-CM

## 2018-09-26 DIAGNOSIS — I714 Abdominal aortic aneurysm, without rupture, unspecified: Secondary | ICD-10-CM

## 2018-09-26 DIAGNOSIS — I6523 Occlusion and stenosis of bilateral carotid arteries: Secondary | ICD-10-CM

## 2018-09-26 DIAGNOSIS — I10 Essential (primary) hypertension: Secondary | ICD-10-CM | POA: Diagnosis not present

## 2018-09-26 DIAGNOSIS — I25110 Atherosclerotic heart disease of native coronary artery with unstable angina pectoris: Secondary | ICD-10-CM | POA: Diagnosis not present

## 2018-09-26 NOTE — Patient Instructions (Signed)

## 2018-09-26 NOTE — Progress Notes (Signed)
Virtual Visit via Telephone Note    Evaluation Performed:  Follow-up visit  This visit type was conducted due to national recommendations for restrictions regarding the COVID-19 Pandemic (e.g. social distancing).  This format is felt to be most appropriate for this patient at this time.  All issues noted in this document were discussed and addressed.  No physical exam was performed (except for noted visual exam findings with Video Visits).  Please refer to the patient's chart (MyChart message for video visits and phone note for telephone visits) for the patient's consent to telehealth for [facility].  Date:  09/26/2018   ID:  James Armstrong, DOB September 02, 1940, MRN [id number]  Patient Location: Home [address] [place] [address]   Provider location:   [facility]  PCP:  Paulina FusiSchultz, Douglas E, MD  Cardiologist:  No primary care provider on file.  Dr. Dulce SellarMunley Electrophysiologist:  None   Chief Complaint:  FU for CAD  History of Present Illness:    James Armstrong is a 78 y.o. male who presents via audio/video conferencing for a telehealth visit today.  His last office visit  03/31/2019.  He is practicing social distance in isolation has had no fever chills cough or shortness of breath.  He has had no exposure to individuals with COVID-19 or travel.  Fortunately medical therapy has had no angina edema shortness of breath palpitation syncope or TIA.  Recent labs done his PCP office 08/24/2018 cholesterol 169 HDL 41 LDL 105 creatinine [id number] potassium 4.4 liver function normal.  He discussed asked and reviewed reviewed his noninvasive study abdominal aorta for aneurysm carotid duplex at the next visit we will need to follow-up for his abdominal aortic aneurysm.  He has had no bleeding complication from his dual antiplatelet therapy The patient does not symptoms concerning for COVID-19 infection (fever, chills, cough, or new SHORTNESS OF BREATH).   He has a history of CAD with  PCI / Res[employer] Drug Eluting Stent of the distal Left Main/Ostial LCF 08/09/16 for troponin normal unstable angina, Dyslipidemia, HTN, S/P CABG in 2003, CVA, with CEA and a small abdominal aneurysm   Prior CV studies:   The following studies were reviewed today:  Most recent lipid profile 06/19/2018 total 173 HDL 41 LDL 112 ultrasound abdominal aorta performed 05/16/2018 largest diameter abdominal aortic aneurysm 3.5 cm increased from the previous of  2017 vascular ultrasound carotids performed 05/16/2018 showed 40 to 59% stenosis ICA bilaterally.  Past Medical History:  Diagnosis Date  . Abdominal aortic aneurysm (AAA) 30 to 34 mm in diameter (HCC)   . Abdominal aortic aneurysm (HCC) 02/24/2016  . Asthma   . Chest pain 02/22/2017  . Coronary artery disease involving native coronary artery of native heart with angina pectoris (HCC) 02/24/2016   CABG in 2004  coronary angiography done 08/09/16 Abnormal Diagnostic Summary Chronic Total Occlusion of the RCA Severe stenosis of the LM, LAD, Circumflex Patent LIMA graft to the mid LAD. Patent SVG graft to the Ramus coronary artery. Moderate disease of SVG graft to the Posterior descending coronary artery. LV not done due to tortuosity of right subclavian Interventional Summary Successful PCI / Resolute Drug Eluting Stent of the distal Left Main/Ostial Circumflex Coronary Artery.      . Crohn disease (HCC)   . Essential hypertension 02/24/2016  . GERD (gastroesophageal reflux disease)   . Hx of CABG 02/24/2016   Overview:  In 2003  . Hyperlipemia 02/24/2016  . Shortness of breath 08/06/2016  .  Sinus bradycardia 08/06/2016  . Stroke [facility])    Past Surgical History:  Procedure Laterality Date  . CARDIAC CATHETERIZATION    . CAROTID ENDARTERECTOMY    . CHOLECYSTECTOMY    . CORONARY ARTERY BYPASS GRAFT     2004  . HERNIA REPAIR    . LEFT HEART CATH AND CORS/GRAFTS ANGIOGRAPHY N/A 07/26/2017   Procedure: LEFT HEART CATH AND CORS/GRAFTS ANGIOGRAPHY;   Surgeon: Kathleene Hazel, MD;  Location: [facility] INV[facility]IVE CV LAB;  Service: Cardiovascular;  Laterality: N/A;     Current Meds  Medication Sig  . aspirin EC 81 MG tablet Take 81 mg by mouth daily.  Marland Kitchen atorvastatin (LIPITOR) 40 MG tablet Take 1 tablet (40 mg total) by mouth daily.  . Cholecalciferol (VITAMIN D3 PO) Take 1 capsule by mouth daily.  . clopidogrel (PLAVIX) 75 MG tablet Take 75 mg by mouth daily.  Marland Kitchen escitalopram (LEXAPRO) 5 MG tablet Take 5 mg by mouth daily.  . fenofibrate 160 MG tablet Take 160 mg by mouth daily.  Marland Kitchen ibuprofen (ADVIL,MOTRIN) 200 MG tablet Take 400 mg by mouth every 6 (six) hours as needed for headache or moderate pain.  . isosorbide mononitrate (IMDUR) 30 MG 24 hr tablet TAKE 1 TABLET BY MOUTH ONCE DAILY  . metoprolol tartrate (LOPRESSOR) 50 MG tablet Take 0.5 tablets (25 mg total) by mouth daily.  . Multiple Vitamins-Minerals (MULTIVITAMIN PO) Take 1 tablet by mouth daily.  . nitroGLYCERIN (NITROSTAT) 0.4 MG SL tablet Place 1 tablet (0.4 mg total) under the tongue every 5 (five) minutes as needed for chest pain.  Marland Kitchen omeprazole (PRILOSEC) 20 MG capsule Take 40 mg by mouth at bedtime.     Allergies:   Ranexa [ranolazine]   Social History   Tobacco Use  . Smoking status: Never Smoker  . Smokeless tobacco: Never Used  Substance Use Topics  . Alcohol use: No  . Drug use: No     Family Hx: The patient's family history includes Diabetes in his brother; Heart attack in his brother; Stroke in his mother.  ROS:   Please see the history of present illness.     All other systems reviewed and are negative.   Labs/Other Tests and Data Reviewed:    Recent Labs: No results found for requested labs within last 8760 hours.   Recent Lipid Panel Lab Results  Component Value Date/Time   CHOL 173 06/19/2018 08:05 AM   TRIG 102 06/19/2018 08:05 AM   HDL 41 06/19/2018 08:05 AM   CHOLHDL 4.2 06/19/2018 08:05 AM   LDLCALC 112 (H) 06/19/2018 08:05 AM    Wt  Readings from Last 3 Encounters:  09/26/18 150 lb 9.6 oz (68.3 kg)  03/30/18 157 lb (71.2 kg)  02/01/18 158 lb (71.7 kg)     Exam:    Vital Signs:  BP 130/72 (BP Location: Left Arm, Patient Position: Standing)   Pulse 64   Ht 5\' 6"  (1.676 m)   Wt 150 lb 9.6 oz (68.3 kg)   BMI 24.31 kg/m      ASSESSMENT & PLAN:    1.  Coronary artery disease stable New York Heart Association class I continue medical therapy including long-term dual antiplatelet treatment beta-blocker oral nitrates and lipid-lowering therapy with statin and fenofibrate.  At this time he does not require an ischemia evaluation 2.  Hypertension stable continue current treatment occluding beta-blocker with CAD 3.  Hyperlipidemia stable continue combined therapy high intensity statin and fenofibrate. 4.  Abdominal aortic aneurysm small  he did increase over the previous 2 years we will do a duplex when seen in the office in 6 months this time does not require with referral to vascular surgery for intervention 5.  Carotid stenosis stable mild bilateral continue medical therapy antiplatelet and hypertensive and lipid-lowering  COVID-19 Education: The signs and symptoms of COVID-19 were discussed with the patient and how to seek care for testing (follow up with PCP or arrange E-visit).  The importance of social distancing was discussed today.  Patient Risk:   After full review of this patients clinical status, I feel that they are at least moderate risk at this time.  Time:   Today, I have spent 23 minutes with the patient with telehealth technology discussing CAD ongoing treatment including dual antiplatelet therapy hypertension well controlled lipid-lowering therapy effective with combination statin fenofibrate and peripheral vascular disease with stable abdominal aortic aneurysm and carotid stenosis mild bilateral..     Medication Adjustments/Labs and Tests Ordered: Current medicines are reviewed at length with the  patient today.  Concerns regarding medicines are outlined above.  Tests Ordered: No orders of the defined types were placed in this encounter.  Medication Changes: No orders of the defined types were placed in this encounter.   Disposition:  in 6 month(s)  Signed, Norman Herrlich, MD  09/26/2018 10:38 AM    [facility]

## 2018-11-24 ENCOUNTER — Other Ambulatory Visit: Payer: Self-pay | Admitting: Cardiology

## 2018-11-24 DIAGNOSIS — I25110 Atherosclerotic heart disease of native coronary artery with unstable angina pectoris: Secondary | ICD-10-CM

## 2019-02-16 ENCOUNTER — Other Ambulatory Visit: Payer: Self-pay | Admitting: Cardiology

## 2019-02-16 DIAGNOSIS — I25110 Atherosclerotic heart disease of native coronary artery with unstable angina pectoris: Secondary | ICD-10-CM

## 2019-02-22 DIAGNOSIS — Z79899 Other long term (current) drug therapy: Secondary | ICD-10-CM | POA: Diagnosis not present

## 2019-02-22 DIAGNOSIS — E785 Hyperlipidemia, unspecified: Secondary | ICD-10-CM | POA: Diagnosis not present

## 2019-02-22 DIAGNOSIS — Z1331 Encounter for screening for depression: Secondary | ICD-10-CM | POA: Diagnosis not present

## 2019-02-22 DIAGNOSIS — I2510 Atherosclerotic heart disease of native coronary artery without angina pectoris: Secondary | ICD-10-CM | POA: Diagnosis not present

## 2019-02-22 DIAGNOSIS — Z139 Encounter for screening, unspecified: Secondary | ICD-10-CM | POA: Diagnosis not present

## 2019-02-25 ENCOUNTER — Telehealth: Payer: Self-pay | Admitting: Cardiology

## 2019-02-25 ENCOUNTER — Other Ambulatory Visit: Payer: Self-pay

## 2019-02-25 DIAGNOSIS — I25110 Atherosclerotic heart disease of native coronary artery with unstable angina pectoris: Secondary | ICD-10-CM

## 2019-02-25 MED ORDER — ISOSORBIDE MONONITRATE ER 30 MG PO TB24: 30.0000 mg | ORAL_TABLET | Freq: Every day | ORAL | 1 refills | Status: DC

## 2019-02-25 NOTE — Telephone Encounter (Signed)
Call Imdur to [facility]

## 2019-04-10 ENCOUNTER — Ambulatory Visit: Payer: Medicare Other | Admitting: Cardiology

## 2019-04-20 DIAGNOSIS — Z23 Encounter for immunization: Secondary | ICD-10-CM | POA: Diagnosis not present

## 2019-04-29 ENCOUNTER — Other Ambulatory Visit: Payer: Self-pay

## 2019-04-29 ENCOUNTER — Encounter: Payer: Self-pay | Admitting: Family

## 2019-04-29 ENCOUNTER — Telehealth (INDEPENDENT_AMBULATORY_CARE_PROVIDER_SITE_OTHER): Payer: Medicare Other | Admitting: Family

## 2019-04-29 VITALS — BP 112/74 | HR 61 | Ht 66.0 in | Wt 156.8 lb

## 2019-04-29 DIAGNOSIS — I714 Abdominal aortic aneurysm, without rupture, unspecified: Secondary | ICD-10-CM

## 2019-04-29 DIAGNOSIS — I25119 Atherosclerotic heart disease of native coronary artery with unspecified angina pectoris: Secondary | ICD-10-CM

## 2019-04-29 DIAGNOSIS — E7800 Pure hypercholesterolemia, unspecified: Secondary | ICD-10-CM | POA: Diagnosis not present

## 2019-04-29 DIAGNOSIS — E785 Hyperlipidemia, unspecified: Secondary | ICD-10-CM | POA: Diagnosis not present

## 2019-04-29 MED ORDER — NITROGLYCERIN 0.4 MG SL SUBL: 0.4000 mg | SUBLINGUAL_TABLET | SUBLINGUAL | 12 refills | Status: DC | PRN

## 2019-04-29 NOTE — Patient Instructions (Signed)
Medication Instructions:  No medication changes today.   *If you need a refill on your cardiac medications before your next appointment, please call your pharmacy*  Lab Work: No lab work ordered today.   If you have labs (blood work) drawn today and your tests are completely normal, you will receive your results only by: Marland Kitchen MyChart Message (if you have MyChart) OR . A paper copy in the mail If you have any lab test that is abnormal or we need to change your treatment, we will call you to review the results.  Testing/Procedures: We have ordered an ultrasound of your abdominal aortic aneurysm for monitoring. This has been scheduled at our [place] office for 05/06/19 at 10:15AM. Please arrive 15 minutes early.   Follow-Up: At [facility], you and your health needs are our priority.  As part of our continuing mission to provide you with exceptional heart care, we have created designated Provider Care Teams.  These Care Teams include your primary Cardiologist (physician) and Advanced Practice Providers (APPs -  Physician Assistants and Nurse Practitioners) who all work together to provide you with the care you need, when you need it.  Your next appointment:   6 weeks  The format for your next appointment:   In Person  Provider:   Shirlee More, MD  Other Instructions  We like for each patient to follow up in-person after any testing. We have made an appointment for you with Dr. Bettina Gavia at the [facility] office 06/07/19 at 9:20 AM. We will plan to get an EKG that day as we like to have an updated EKG at least once per year.   Preventing High Cholesterol Cholesterol is a white, waxy substance similar to fat that the human body needs to help build cells. The liver makes all the cholesterol that a person's body needs. Having high cholesterol (hypercholesterolemia) increases a person's risk for heart disease and stroke. Extra (excess) cholesterol comes from the food the person eats. High  cholesterol can often be prevented with diet and lifestyle changes. If you already have high cholesterol, you can control it with diet and lifestyle changes and with medicine. How can high cholesterol affect me? If you have high cholesterol, deposits (plaques) may build up on the walls of your arteries. The arteries are the blood vessels that carry blood away from your heart. Plaques make the arteries narrower and stiffer. This can limit or block blood flow and cause blood clots to form. Blood clots:  Are tiny balls of cells that form in your blood.  Can move to the heart or brain, causing a heart attack or stroke. Plaques in arteries greatly increase your risk for heart attack and stroke.Making diet and lifestyle changes can reduce your risk for these conditions that may threaten your life. What can increase my risk? This condition is more likely to develop in people who:  Eat foods that are high in saturated fat or cholesterol. Saturated fat is mostly found in: ? Foods that contain animal fat, such as red meat and some dairy products. ? Certain fatty foods made from plants, such as tropical oils.  Are overweight.  Are not getting enough exercise.  Have a family history of high cholesterol. What actions can I take to prevent this? Nutrition   Eat less saturated fat.  Avoid trans fats (partially hydrogenated oils). These are often found in margarine and in some baked goods, fried foods, and snacks bought in packages.  Avoid precooked or cured meat, such as  sausages or meat loaves.  Avoid foods and drinks that have added sugars.  Eat more fruits, vegetables, and whole grains.  Choose healthy sources of protein, such as fish, poultry, lean cuts of red meat, beans, peas, lentils, and nuts.  Choose healthy sources of fat, such as: ? Nuts. ? Vegetable oils, especially olive oil. ? Fish that have healthy fats (omega-3 fatty acids), such as mackerel or salmon. The items listed above  may not be a complete list of recommended foods and beverages. Contact a dietitian for more information. Lifestyle  Lose weight if you are overweight. Losing 5-10 lb (2.3-4.5 kg) can help prevent or control high cholesterol. It can also lower your risk for diabetes and high blood pressure. Ask your health care provider to help you with a diet and exercise plan to lose weight safely.  Do not use any products that contain nicotine or tobacco, such as cigarettes, e-cigarettes, and chewing tobacco. If you need help quitting, ask your health care provider.  Limit your alcohol intake. ? Do not drink alcohol if:  Your health care provider tells you not to drink.  You are pregnant, may be pregnant, or are planning to become pregnant. ? If you drink alcohol:  Limit how much you use to:  0-1 drink a day for women.  0-2 drinks a day for men.  Be aware of how much alcohol is in your drink. In the [place] one drink equals one 12 oz bottle of beer (355 mL), one 5 oz glass of wine (148 mL), or one 1 oz glass of hard liquor (44 mL). Activity   Get enough exercise. Each week, do at least 150 minutes of exercise that takes a medium level of effort (moderate-intensity exercise). ? This is exercise that:  Makes your heart beat faster and makes you breathe harder than usual.  Allows you to still be able to talk. ? You could exercise in short sessions several times a day or longer sessions a few times a week. For example, on 5 days each week, you could walk fast or ride your bike 3 times a day for 10 minutes each time.  Do exercises as told by your health care provider. Medicines  In addition to diet and lifestyle changes, your health care provider may recommend medicines to help lower cholesterol. This may be a medicine to lower the amount of cholesterol your liver makes. You may need medicine if: ? Diet and lifestyle changes do not lower your cholesterol enough. ? You have high cholesterol and other  risk factors for heart disease or stroke.  Take over-the-counter and prescription medicines only as told by your health care provider. General information  Manage your risk factors for high cholesterol. Talk with your health care provider about all your risk factors and how to lower your risk.  Manage other conditions that you have, such as diabetes or high blood pressure (hypertension).  Have blood tests to check your cholesterol levels at regular points in time as told by your health care provider.  Keep all follow-up visits as told by your health care provider. This is important. Where to find more information  American Heart Association: www.heart.org  National Heart, Lung, and Blood Institute: PopSteam.is Summary  High cholesterol increases your risk for heart disease and stroke. By keeping your cholesterol level low, you can reduce your risk for these conditions.  High cholesterol can often be prevented with diet and lifestyle changes.  Work with your health care provider to manage  your risk factors, and have your blood tested regularly. This information is not intended to replace advice given to you by your health care provider. Make sure you discuss any questions you have with your health care provider. Document Released: 07/05/2015 Document Revised: 10/12/2018 Document Reviewed: 02/27/2016 Elsevier Patient Education  2020 [employer].

## 2019-04-29 NOTE — Progress Notes (Signed)
Virtual Visit via Video Note   This visit type was conducted due to national recommendations for restrictions regarding the COVID-19 Pandemic (e.g. social distancing) in an effort to limit this patient's exposure and mitigate transmission in our community.  Due to his co-morbid illnesses, this patient is at least at moderate risk for complications without adequate follow up.  This format is felt to be most appropriate for this patient at this time.  All issues noted in this document were discussed and addressed.  A limited physical exam was performed with this format.  Please refer to the patient's chart for his consent to telehealth for [facility]. Evaluation Performed:  Follow-up visit  This visit type was conducted due to national recommendations for restrictions regarding the COVID-19 Pandemic (e.g. social distancing).  This format is felt to be most appropriate for this patient at this time.  All issues noted in this document were discussed and addressed.  No physical exam was performed (except for noted visual exam findings with Video Visits).  Please refer to the patient's chart (MyChart message for video visits and phone note for telephone visits) for the patient's consent to telehealth for [facility]. _____________   Date:  04/29/2019   Patient ID:  James Armstrong, DOB 04-15-1941, MRN [id number] Patient Location:  Home Provider location:   [facility] office  Primary Care Provider:  Paulina Fusi, MD Primary Cardiologist:  No primary care provider on file.  Chief Complaint    78 yo male presents for 6 month f/u of CAD.  Past Medical History    Past Medical History:  Diagnosis Date  . Abdominal aortic aneurysm (AAA) 30 to 34 mm in diameter (HCC)   . Abdominal aortic aneurysm (HCC) 02/24/2016  . Asthma   . Chest pain 02/22/2017  . Coronary artery disease involving native coronary artery of native heart with angina pectoris (HCC) 02/24/2016   CABG in 2004  coronary  angiography done 08/09/16 Abnormal Diagnostic Summary Chronic Total Occlusion of the RCA Severe stenosis of the LM, LAD, Circumflex Patent LIMA graft to the mid LAD. Patent SVG graft to the Ramus coronary artery. Moderate disease of SVG graft to the Posterior descending coronary artery. LV not done due to tortuosity of right subclavian Interventional Summary Successful PCI / Resolute Drug Eluting Stent of the distal Left Main/Ostial Circumflex Coronary Artery.      . Crohn disease (HCC)   . Essential hypertension 02/24/2016  . GERD (gastroesophageal reflux disease)   . Hx of CABG 02/24/2016   Overview:  In 2003  . Hyperlipemia 02/24/2016  . Shortness of breath 08/06/2016  . Sinus bradycardia 08/06/2016  . Stroke [facility])    Past Surgical History:  Procedure Laterality Date  . CARDIAC CATHETERIZATION    . CAROTID ENDARTERECTOMY    . CHOLECYSTECTOMY    . CORONARY ARTERY BYPASS GRAFT     2004  . HERNIA REPAIR    . LEFT HEART CATH AND CORS/GRAFTS ANGIOGRAPHY N/A 07/26/2017   Procedure: LEFT HEART CATH AND CORS/GRAFTS ANGIOGRAPHY;  Surgeon: Kathleene Hazel, MD;  Location: [facility] INVASIVE CV LAB;  Service: Cardiovascular;  Laterality: N/A;    Allergies  Allergies  Allergen Reactions  . Ranexa [Ranolazine] Nausea Only    History of Present Illness    James Armstrong is a 78 y.o. male who presents via audio/video conferencing for a telehealth visit today.  PMH CAD, HTN, HLD, abdominal aortic aneurysm.   He reports feeling well. Has no complaints today. Has  been trying to be active by working in the yard. Endorses he could increase his activity a bit. He and his wife have been eating mostly at home and trying to follow a heart healthy diet. We discussed repeat imaging of his AAA and he is agreeable to have this done.  He does report fatigue over the last month.  Reports he is fine in the morning and then more tired throughout the day.  Endorses that he has been sitting around the house more than  he is used to.  We discussed trying to increase his level of activity, heart healthy eating intake how he feels.  We also discussed talking with his PCP regarding further work-up.  Previous cardiac imaging:  Ultrasound abdominal aorta performed 05/16/2018 largest diameter abdominal aortic aneurysm 3.5 cm increased from the previous of  2017 vascular   Ultrasound carotids performed 05/16/2018 showed 40 to 59% stenosis ICA bilaterally.  The patient does not have symptoms concerning for COVID-19 infection (fever, chills, cough, or new shortness of breath).   Home Medications    Prior to Admission medications   Medication Sig Start Date End Date Taking? Authorizing Provider  Ascorbic Acid (VITAMIN C) 1000 MG tablet Take 2,000 mg by mouth daily.   Yes [provider]  aspirin EC 81 MG tablet Take 81 mg by mouth daily.   Yes [provider]  atorvastatin (LIPITOR) 40 MG tablet Take 1 tablet (40 mg total) by mouth daily. Patient taking differently: Take 80 mg by mouth daily.  04/23/18 09/25/21 Yes Richardo Priest, MD  Cholecalciferol (VITAMIN D3 PO) Take 1 capsule by mouth daily.   Yes [provider]  clopidogrel (PLAVIX) 75 MG tablet Take 75 mg by mouth daily.   Yes [provider]  escitalopram (LEXAPRO) 5 MG tablet Take 5 mg by mouth daily. 07/10/18  Yes [provider]  fenofibrate 160 MG tablet Take 160 mg by mouth daily. 11/12/16  Yes [provider]  ibuprofen (ADVIL,MOTRIN) 200 MG tablet Take 400 mg by mouth every 6 (six) hours as needed for headache or moderate pain.   Yes [provider]  isosorbide mononitrate (IMDUR) 30 MG 24 hr tablet Take 1 tablet (30 mg total) by mouth daily. 02/25/19  Yes Richardo Priest, MD  metoprolol tartrate (LOPRESSOR) 50 MG tablet Take 0.5 tablets (25 mg total) by mouth daily. 02/01/18  Yes Richardo Priest, MD  Multiple Vitamins-Minerals (MULTIVITAMIN PO) Take 1 tablet by mouth daily.   Yes [provider]  nitroGLYCERIN (NITROSTAT) 0.4 MG SL tablet Place 1 tablet (0.4 mg total) under the tongue every 5 (five) minutes as needed for chest pain. 04/29/19  Yes Loel Dubonnet, NP  omega-3 acid ethyl esters (LOVAZA) 1 g capsule Take 2 g by mouth daily.   Yes [provider]  omeprazole (PRILOSEC) 20 MG capsule Take 40 mg by mouth at bedtime.   Yes [provider]     Review of Systems     All other systems reviewed and are otherwise negative except as noted above.  Physical Exam    Vital Signs:  BP 112/74   Pulse 61   Ht 5\' 6"  (1.676 m)   Wt 156 lb 12.8 oz (71.1 kg)   BMI 25.31 kg/m    Well nourished, well developed male in no acute distress. Respirations regular, unlabored.   Assessment & Plan    1. CAD s/p CABG 2004 - Stable. No chest pain, DOE. Continue GDMT  of DAPT (aspirin, plavix), beta blocker, statin, PRN nitroglycerin.  No indication ischemic evaluation at this time  2. HLD- Stable. Follows with PCP. Continue atorvastatin, fenofibrate.  3. HTN - BP well controlled. Continue present anti-hypertensive regimen.   4. AAA - Repeat AAA duplex ordered for monitoring.   5. Fatigue - Onset over the last 1 month. Encouraged regular exercise, healthy diet, adequate sleep. Recent Hb 02/2019 normal, good color on exam, no reports bleeding - low suspicion anemia. Discussed talking with his PCP regarding further workup.   COVID-19 Education: The signs and symptoms of COVID-19 were discussed with the patient and how to seek care for testing (follow up with PCP or arrange E-visit).  The importance of social distancing was discussed today.  Patient Risk:   After full review of this patient's history and clinical status, I feel that he is at least moderate risk for cardiac complications at this time, thus necessitating a telehealth visit sooner than our first available in office visit.  Time:   Today, I have spent 15 minutes with the patient with  telehealth technology discussing CAD, AAA, lifestyle recommendations, fatigue.    Disposition:   Alver Sorrow, NP 04/29/2019, 1:54 PM

## 2019-04-30 DIAGNOSIS — Z0000 Encounter for general adult medical examination without abnormal findings: Secondary | ICD-10-CM | POA: Diagnosis not present

## 2019-04-30 DIAGNOSIS — Z125 Encounter for screening for malignant neoplasm of prostate: Secondary | ICD-10-CM | POA: Diagnosis not present

## 2019-04-30 DIAGNOSIS — E785 Hyperlipidemia, unspecified: Secondary | ICD-10-CM | POA: Diagnosis not present

## 2019-04-30 DIAGNOSIS — Z9181 History of falling: Secondary | ICD-10-CM | POA: Diagnosis not present

## 2019-04-30 DIAGNOSIS — Z1331 Encounter for screening for depression: Secondary | ICD-10-CM | POA: Diagnosis not present

## 2019-05-06 ENCOUNTER — Other Ambulatory Visit: Payer: Self-pay

## 2019-05-06 ENCOUNTER — Ambulatory Visit (INDEPENDENT_AMBULATORY_CARE_PROVIDER_SITE_OTHER): Payer: Medicare Other

## 2019-05-06 DIAGNOSIS — I714 Abdominal aortic aneurysm, without rupture: Secondary | ICD-10-CM

## 2019-05-06 NOTE — Progress Notes (Signed)
AAA complete results located under CV Proc.  Darlina Sicilian RDCS

## 2019-05-08 ENCOUNTER — Telehealth: Payer: Self-pay | Admitting: Emergency Medicine

## 2019-05-08 NOTE — Telephone Encounter (Signed)
Left message for patient to return call regarding results  

## 2019-06-07 ENCOUNTER — Other Ambulatory Visit: Payer: Self-pay

## 2019-06-07 ENCOUNTER — Encounter: Payer: Self-pay | Admitting: Cardiology

## 2019-06-07 ENCOUNTER — Ambulatory Visit (INDEPENDENT_AMBULATORY_CARE_PROVIDER_SITE_OTHER): Payer: Medicare Other | Admitting: Cardiology

## 2019-06-07 VITALS — BP 122/68 | HR 60 | Ht 66.0 in | Wt 156.0 lb

## 2019-06-07 DIAGNOSIS — E7800 Pure hypercholesterolemia, unspecified: Secondary | ICD-10-CM | POA: Diagnosis not present

## 2019-06-07 DIAGNOSIS — I25119 Atherosclerotic heart disease of native coronary artery with unspecified angina pectoris: Secondary | ICD-10-CM | POA: Diagnosis not present

## 2019-06-07 DIAGNOSIS — I10 Essential (primary) hypertension: Secondary | ICD-10-CM

## 2019-06-07 DIAGNOSIS — I714 Abdominal aortic aneurysm, without rupture, unspecified: Secondary | ICD-10-CM

## 2019-06-07 NOTE — Patient Instructions (Signed)
Medication Instructions:  Your physician recommends that you continue on your current medications as directed. Please refer to the Current Medication list given to you today.  *If you need a refill on your cardiac medications before your next appointment, please call your pharmacy*  Lab Work: None  If you have labs (blood work) drawn today and your tests are completely normal, you will receive your results only by: . MyChart Message (if you have MyChart) OR . A paper copy in the mail If you have any lab test that is abnormal or we need to change your treatment, we will call you to review the results.  Testing/Procedures: You had an EKG today.   Follow-Up: At [facility], you and your health needs are our priority.  As part of our continuing mission to provide you with exceptional heart care, we have created designated Provider Care Teams.  These Care Teams include your primary Cardiologist (physician) and Advanced Practice Providers (APPs -  Physician Assistants and Nurse Practitioners) who all work together to provide you with the care you need, when you need it.  Your next appointment:   6 month(s)  The format for your next appointment:   In Person  Provider:   Brian Munley, MD   

## 2019-06-07 NOTE — Progress Notes (Signed)
Cardiology Office Note:    Date:  06/07/2019   ID:  James Armstrong, DOB 07-May-1941, MRN [id number]  PCP:  James Fusi, MD  Cardiologist:  James Herrlich, MD    Referring MD: James Fusi, MD    ASSESSMENT:    1. Abdominal aortic aneurysm (AAA) without rupture (HCC)   2. Coronary artery disease involving native coronary artery of native heart with angina pectoris (HCC)   3. Pure hypercholesterolemia   4. Essential hypertension    PLAN:    In order of problems listed above:  1. Stable on recent duplex I think we can safely wait 1 to 2 years to repeat for small stable distal aortic aneurysm. 2. Stable CAD having no anginal discomfort he will continue his dual antiplatelet therapy oral nitrate beta-blocker and high intensity statin. 3. Continue his current lipid lowering therapy with CAD\stable BP continue treatment beta-blocker   Next appointment: 6 months   Medication Adjustments/Labs and Tests Ordered: Current medicines are reviewed at length with the patient today.  Concerns regarding medicines are outlined above.  Orders Placed This Encounter  Procedures  . EKG 12-Lead   No orders of the defined types were placed in this encounter.   Chief Complaint  Patient presents with  . Follow-up    After aortic aneurysm duplex    History of Present Illness:    James Armstrong is a 78 y.o. male with a hx of  CAD with PCI / Resolute Drug Eluting Stent of the distal Left Main/Ostial LCF 08/09/16 for troponin normal unstable angina, Dyslipidemia, HTN, S/P CABG in 2003, CVA, with CEA and a small abdominal aneurysm 04/29/2019 Last seen.  Duplex abdominal aorta 1102 09/2018: Stable maximum measuring 3.1 cm.  Aneurysm distal abdominal aorta.  Compliance with diet, lifestyle and medications: Yes  [name] is feeling improved he has had no angina dyspnea palpitation or syncope and is reassured by the stable nature of his small distal aortic aneurysm. Past Medical History:   Diagnosis Date  . Abdominal aortic aneurysm (AAA) 30 to 34 mm in diameter (HCC)   . Abdominal aortic aneurysm (HCC) 02/24/2016  . Asthma   . Chest pain 02/22/2017  . Coronary artery disease involving native coronary artery of native heart with angina pectoris (HCC) 02/24/2016   CABG in 2004  coronary angiography done 08/09/16 Abnormal Diagnostic Summary Chronic Total Occlusion of the RCA Severe stenosis of the LM, LAD, Circumflex Patent LIMA graft to the mid LAD. Patent SVG graft to the Ramus coronary artery. Moderate disease of SVG graft to the Posterior descending coronary artery. LV not done due to tortuosity of right subclavian Interventional Summary Successful PCI / Resolute Drug Eluting Stent of the distal Left Main/Ostial Circumflex Coronary Artery.      . Crohn disease (HCC)   . Essential hypertension 02/24/2016  . GERD (gastroesophageal reflux disease)   . Hx of CABG 02/24/2016   Overview:  In 2003  . Hyperlipemia 02/24/2016  . Shortness of breath 08/06/2016  . Sinus bradycardia 08/06/2016  . Stroke [facility])     Past Surgical History:  Procedure Laterality Date  . CARDIAC CATHETERIZATION    . CAROTID ENDARTERECTOMY    . CHOLECYSTECTOMY    . CORONARY ARTERY BYPASS GRAFT     2004  . HERNIA REPAIR    . LEFT HEART CATH AND CORS/GRAFTS ANGIOGRAPHY N/A 07/26/2017   Procedure: LEFT HEART CATH AND CORS/GRAFTS ANGIOGRAPHY;  Surgeon: James Hazel, MD;  Location: [facility] INVASIVE CV LAB;  Service: Cardiovascular;  Laterality: N/A;    Current Medications: Current Meds  Medication Sig  . Ascorbic Acid (VITAMIN C) 1000 MG tablet Take 2,000 mg by mouth daily.  Marland Kitchen aspirin EC 81 MG tablet Take 81 mg by mouth daily.  Marland Kitchen atorvastatin (LIPITOR) 80 MG tablet Take 80 mg by mouth daily.  . Cholecalciferol (VITAMIN D3 PO) Take 1 capsule by mouth daily.  . clopidogrel (PLAVIX) 75 MG tablet Take 75 mg by mouth daily.  Marland Kitchen escitalopram (LEXAPRO) 5 MG tablet Take 5 mg by mouth daily.  . fenofibrate 160 MG  tablet Take 160 mg by mouth daily.  Marland Kitchen ibuprofen (ADVIL,MOTRIN) 200 MG tablet Take 400 mg by mouth every 6 (six) hours as needed for headache or moderate pain.  . isosorbide mononitrate (IMDUR) 30 MG 24 hr tablet Take 1 tablet (30 mg total) by mouth daily.  . metoprolol tartrate (LOPRESSOR) 50 MG tablet Take 0.5 tablets (25 mg total) by mouth daily.  . Multiple Vitamins-Minerals (MULTIVITAMIN PO) Take 1 tablet by mouth daily.  . nitroGLYCERIN (NITROSTAT) 0.4 MG SL tablet Place 1 tablet (0.4 mg total) under the tongue every 5 (five) minutes as needed for chest pain.  . Omega-3 Fatty Acids (FISH OIL) 1000 MG CAPS Take 1 capsule by mouth 2 (two) times daily.  Marland Kitchen omeprazole (PRILOSEC) 20 MG capsule Take 40 mg by mouth at bedtime.     Allergies:   Ranexa [ranolazine]   Social History   Socioeconomic History  . Marital status: Single    Spouse name: Not on file  . Number of children: Not on file  . Years of education: Not on file  . Highest education level: Not on file  Occupational History  . Not on file  Social Needs  . Financial resource strain: Not on file  . Food insecurity    Worry: Not on file    Inability: Not on file  . Transportation needs    Medical: Not on file    Non-medical: Not on file  Tobacco Use  . Smoking status: Never Smoker  . Smokeless tobacco: Never Used  Substance and Sexual Activity  . Alcohol use: No  . Drug use: No  . Sexual activity: Not on file  Lifestyle  . Physical activity    Days per week: Not on file    Minutes per session: Not on file  . Stress: Not on file  Relationships  . Social Herbalist on phone: Not on file    Gets together: Not on file    Attends religious service: Not on file    Active member of club or organization: Not on file    Attends meetings of clubs or organizations: Not on file    Relationship status: Not on file  Other Topics Concern  . Not on file  Social History Narrative  . Not on file     Family  History: The patient's family history includes Diabetes in his brother; Heart attack in his brother; Stroke in his mother. ROS:   Please see the history of present illness.    All other systems reviewed and are negative.  EKGs/Labs/Other Studies Reviewed:    The following studies were reviewed today:  EKG:  EKG ordered today and personally reviewed.  The ekg ordered today demonstrates sinus rhythm first-degree AV block  Recent Labs: No results found for requested labs within last 8760 hours.  Recent Lipid Panel    Component Value Date/Time   CHOL 173  06/19/2018 0805   TRIG 102 06/19/2018 0805   HDL 41 06/19/2018 0805   CHOLHDL 4.2 06/19/2018 0805   LDLCALC 112 (H) 06/19/2018 0805    Physical Exam:    VS:  BP 122/68 (BP Location: Right Arm, Patient Position: Sitting, Cuff Size: Normal)   Pulse 60   Ht 5\' 6"  (1.676 m)   Wt 156 lb (70.8 kg)   SpO2 99%   BMI 25.18 kg/m     Wt Readings from Last 3 Encounters:  06/07/19 156 lb (70.8 kg)  04/29/19 156 lb 12.8 oz (71.1 kg)  09/26/18 150 lb 9.6 oz (68.3 kg)     GEN:  Well nourished, well developed in no acute distress HEENT: Normal NECK: No JVD; No carotid bruits LYMPHATICS: No lymphadenopathy CARDIAC: RRR, no murmurs, rubs, gallops RESPIRATORY:  Clear to auscultation without rales, wheezing or rhonchi  ABDOMEN: Soft, non-tender, non-distended MUSCULOSKELETAL:  No edema; No deformity  SKIN: Warm and dry NEUROLOGIC:  Alert and oriented x 3 PSYCHIATRIC:  Normal affect    Signed, 09/28/18, MD  06/07/2019 10:07 AM    [facility]

## 2019-08-27 DIAGNOSIS — Z79899 Other long term (current) drug therapy: Secondary | ICD-10-CM | POA: Diagnosis not present

## 2019-08-27 DIAGNOSIS — Z6825 Body mass index (BMI) 25.0-25.9, adult: Secondary | ICD-10-CM | POA: Diagnosis not present

## 2019-08-27 DIAGNOSIS — E785 Hyperlipidemia, unspecified: Secondary | ICD-10-CM | POA: Diagnosis not present

## 2019-08-27 DIAGNOSIS — Z125 Encounter for screening for malignant neoplasm of prostate: Secondary | ICD-10-CM | POA: Diagnosis not present

## 2019-08-27 DIAGNOSIS — I10 Essential (primary) hypertension: Secondary | ICD-10-CM | POA: Diagnosis not present

## 2019-08-27 DIAGNOSIS — I2510 Atherosclerotic heart disease of native coronary artery without angina pectoris: Secondary | ICD-10-CM | POA: Diagnosis not present

## 2019-09-22 ENCOUNTER — Emergency Department (HOSPITAL_COMMUNITY)
Admission: EM | Admit: 2019-09-22 | Discharge: 2019-09-22 | Disposition: A | Discharge status: Home / Self Care | Payer: Medicare HMO | Attending: Emergency Medicine | Admitting: Emergency Medicine

## 2019-09-22 ENCOUNTER — Emergency Department (HOSPITAL_COMMUNITY): Payer: Medicare HMO

## 2019-09-22 ENCOUNTER — Encounter (HOSPITAL_COMMUNITY): Payer: Self-pay | Admitting: Emergency Medicine

## 2019-09-22 ENCOUNTER — Other Ambulatory Visit: Payer: Self-pay

## 2019-09-22 DIAGNOSIS — I2510 Atherosclerotic heart disease of native coronary artery without angina pectoris: Secondary | ICD-10-CM | POA: Diagnosis not present

## 2019-09-22 DIAGNOSIS — Z951 Presence of aortocoronary bypass graft: Secondary | ICD-10-CM | POA: Insufficient documentation

## 2019-09-22 DIAGNOSIS — I119 Hypertensive heart disease without heart failure: Secondary | ICD-10-CM | POA: Insufficient documentation

## 2019-09-22 DIAGNOSIS — I714 Abdominal aortic aneurysm, without rupture: Secondary | ICD-10-CM | POA: Insufficient documentation

## 2019-09-22 DIAGNOSIS — R1031 Right lower quadrant pain: Secondary | ICD-10-CM | POA: Insufficient documentation

## 2019-09-22 DIAGNOSIS — Z79899 Other long term (current) drug therapy: Secondary | ICD-10-CM | POA: Diagnosis not present

## 2019-09-22 LAB — CBC
HCT: 39.4 % (ref 39.0–52.0)
Hemoglobin: 12.6 g/dL — ABNORMAL LOW (ref 13.0–17.0)
MCH: 30 pg (ref 26.0–34.0)
MCHC: 32 g/dL (ref 30.0–36.0)
MCV: 93.8 fL (ref 80.0–100.0)
Platelets: 239 10*3/uL (ref 150–400)
RBC: 4.2 MIL/uL — ABNORMAL LOW (ref 4.22–5.81)
RDW: 13.3 % (ref 11.5–15.5)
WBC: 6.3 10*3/uL (ref 4.0–10.5)
nRBC: 0 % (ref 0.0–0.2)

## 2019-09-22 LAB — COMPREHENSIVE METABOLIC PANEL
ALT: 28 U/L (ref 0–44)
AST: 30 U/L (ref 15–41)
Albumin: 3.9 g/dL (ref 3.5–5.0)
Alkaline Phosphatase: 29 U/L — ABNORMAL LOW (ref 38–126)
Anion gap: 12 (ref 5–15)
BUN: 20 mg/dL (ref 8–23)
CO2: 26 mmol/L (ref 22–32)
Calcium: 9.6 mg/dL (ref 8.9–10.3)
Chloride: 101 mmol/L (ref 98–111)
Creatinine, Ser: 1.23 mg/dL (ref 0.61–1.24)
GFR calc Af Amer: >60 mL/min (ref >60)
GFR calc non Af Amer: 56 mL/min — ABNORMAL LOW (ref >60)
Glucose, Bld: 102 mg/dL — ABNORMAL HIGH (ref 70–99)
Potassium: 4.4 mmol/L (ref 3.5–5.1)
Sodium: 139 mmol/L (ref 135–145)
Total Bilirubin: 0.9 mg/dL (ref 0.3–1.2)
Total Protein: 6.5 g/dL (ref 6.5–8.1)

## 2019-09-22 LAB — URINALYSIS, ROUTINE W REFLEX MICROSCOPIC
Bilirubin Urine: NEGATIVE
Glucose, UA: NEGATIVE mg/dL
Hgb urine dipstick: NEGATIVE
Ketones, ur: NEGATIVE mg/dL
Leukocytes,Ua: NEGATIVE
Nitrite: NEGATIVE
Protein, ur: NEGATIVE mg/dL
Specific Gravity, Urine: 1.021 (ref 1.005–1.030)
pH: 5 (ref 5.0–8.0)

## 2019-09-22 LAB — LIPASE, BLOOD: Lipase: 23 U/L (ref 11–51)

## 2019-09-22 MED ORDER — IOHEXOL 300 MG/ML  SOLN
100.0000 mL | Freq: Once | INTRAMUSCULAR | Status: AC | PRN
  Administered 2019-09-22: 100 mL via INTRAVENOUS

## 2019-09-22 NOTE — Discharge Instructions (Addendum)
As discussed, your evaluation today has been largely reassuring.  But, it is important that you monitor your condition carefully, and do not hesitate to return to the ED if you develop new, or concerning changes in your condition. ? ?Otherwise, please follow-up with your physician for appropriate ongoing care. ? ?

## 2019-09-22 NOTE — ED Provider Notes (Signed)
[facility] EMERGENCY DEPARTMENT Provider Note   CSN: [id number] Arrival date & time: 09/22/19  1311     History Chief Complaint  Patient presents with  . Abdominal Pain    James Armstrong is a 79 y.o. male.  HPI   Patient presents with concern of abdominal pain.  Onset was yesterday.  Since that time patient has pain in the right lower quadrant, described as focal, sharp, nonradiating.  Onset was after eating food, and since that time it has been persistent He states that he is generally well though in acknowledges a history of heart disease.  He also has a history of prior cholecystectomy.   Since onset he has tried eating, but pain is worse.  No clear relieving factors. No fever, no chest pain, no dyspnea or other complaints.  Today after speaking with his daughter who is a Publishing rights manager he was sent here for evaluation.   Past Medical History:  Diagnosis Date  . Abdominal aortic aneurysm (AAA) 30 to 34 mm in diameter (HCC)   . Abdominal aortic aneurysm (HCC) 02/24/2016  . Asthma   . Chest pain 02/22/2017  . Coronary artery disease involving native coronary artery of native heart with angina pectoris (HCC) 02/24/2016   CABG in 2004  coronary angiography done 08/09/16 Abnormal Diagnostic Summary Chronic Total Occlusion of the RCA Severe stenosis of the LM, LAD, Circumflex Patent LIMA graft to the mid LAD. Patent SVG graft to the Ramus coronary artery. Moderate disease of SVG graft to the Posterior descending coronary artery. LV not done due to tortuosity of right subclavian Interventional Summary Successful PCI / Resolute Drug Eluting Stent of the distal Left Main/Ostial Circumflex Coronary Artery.      . Crohn disease (HCC)   . Essential hypertension 02/24/2016  . GERD (gastroesophageal reflux disease)   . Hx of CABG 02/24/2016   Overview:  In 2003  . Hyperlipemia 02/24/2016  . Shortness of breath 08/06/2016  . Sinus bradycardia 08/06/2016  . Stroke [facility])      Patient Active Problem List   Diagnosis Date Noted  . Carotid stenosis, bilateral 09/25/2018  . Myopathy 02/01/2018  . Mixed dyslipidemia 07/15/2017  . Presence of stent in coronary artery in patient with coronary artery disease 07/15/2017  . Multiple vessel coronary artery disease 07/15/2017  . Chest pain 02/22/2017  . Sinus bradycardia 08/06/2016  . Shortness of breath 08/06/2016  . Abdominal aortic aneurysm (HCC) 02/24/2016  . Coronary artery disease involving native heart with unstable angina pectoris (HCC) 02/24/2016  . Essential hypertension 02/24/2016  . Hx of CABG 02/24/2016  . Hyperlipemia 02/24/2016    Past Surgical History:  Procedure Laterality Date  . CARDIAC CATHETERIZATION    . CAROTID ENDARTERECTOMY    . CHOLECYSTECTOMY    . CORONARY ARTERY BYPASS GRAFT     2004  . HERNIA REPAIR    . LEFT HEART CATH AND CORS/GRAFTS ANGIOGRAPHY N/A 07/26/2017   Procedure: LEFT HEART CATH AND CORS/GRAFTS ANGIOGRAPHY;  Surgeon: Kathleene Hazel, MD;  Location: [facility] INVASIVE CV LAB;  Service: Cardiovascular;  Laterality: N/A;       Family History  Problem Relation Age of Onset  . Stroke Mother   . Diabetes Brother   . Heart attack Brother     Social History   Tobacco Use  . Smoking status: Never Smoker  . Smokeless tobacco: Never Used  Substance Use Topics  . Alcohol use: No  . Drug use: No    Home  Medications Prior to Admission medications   Medication Sig Start Date End Date Taking? Authorizing Provider  Ascorbic Acid (VITAMIN C) 1000 MG tablet Take 2,000 mg by mouth daily.    [provider]  aspirin EC 81 MG tablet Take 81 mg by mouth daily.    [provider]  atorvastatin (LIPITOR) 80 MG tablet Take 80 mg by mouth daily.    [provider]  Cholecalciferol (VITAMIN D3 PO) Take 1 capsule by mouth daily.    [provider]  clopidogrel (PLAVIX) 75 MG tablet Take 75 mg by mouth daily.    [provider]   escitalopram (LEXAPRO) 5 MG tablet Take 5 mg by mouth daily. 07/10/18   [provider]  fenofibrate 160 MG tablet Take 160 mg by mouth daily. 11/12/16   [provider]  ibuprofen (ADVIL,MOTRIN) 200 MG tablet Take 400 mg by mouth every 6 (six) hours as needed for headache or moderate pain.    [provider]  isosorbide mononitrate (IMDUR) 30 MG 24 hr tablet Take 1 tablet (30 mg total) by mouth daily. 02/25/19   Baldo Daub, MD  metoprolol tartrate (LOPRESSOR) 50 MG tablet Take 0.5 tablets (25 mg total) by mouth daily. 02/01/18   Baldo Daub, MD  Multiple Vitamins-Minerals (MULTIVITAMIN PO) Take 1 tablet by mouth daily.    [provider]  nitroGLYCERIN (NITROSTAT) 0.4 MG SL tablet Place 1 tablet (0.4 mg total) under the tongue every 5 (five) minutes as needed for chest pain. 04/29/19   Alver Sorrow, NP  Omega-3 Fatty Acids (FISH OIL) 1000 MG CAPS Take 1 capsule by mouth 2 (two) times daily.    [provider]  omeprazole (PRILOSEC) 20 MG capsule Take 40 mg by mouth at bedtime.    [provider]    Allergies    Ranexa [ranolazine]  Review of Systems   Review of Systems  Constitutional:       Per HPI, otherwise negative  HENT:       Per HPI, otherwise negative  Respiratory:       Per HPI, otherwise negative  Cardiovascular:       Per HPI, otherwise negative  Gastrointestinal: Positive for abdominal pain. Negative for vomiting.  Endocrine:       Negative aside from HPI  Genitourinary:       Neg aside from HPI   Musculoskeletal:       Per HPI, otherwise negative  Skin: Negative.   Neurological: Negative for syncope.    Physical Exam Updated Vital Signs BP (!) 130/102   Pulse (!) 56   Temp 97.9 F (36.6 C) (Oral)   Resp 18   SpO2 98%   Physical Exam Vitals and nursing note reviewed.  Constitutional:      General: He is not in acute distress.    Appearance: He is well-developed.  HENT:     Head:  Normocephalic and atraumatic.  Eyes:     Conjunctiva/sclera: Conjunctivae normal.  Cardiovascular:     Rate and Rhythm: Normal rate and regular rhythm.  Pulmonary:     Effort: Pulmonary effort is normal. No respiratory distress.     Breath sounds: No stridor.  Abdominal:     General: There is no distension.     Tenderness: There is abdominal tenderness. Positive signs include McBurney's sign.  Skin:    General: Skin is warm and dry.  Neurological:     Mental Status: He is alert and oriented to  person, place, and time.     ED Results / Procedures / Treatments   Labs (all labs ordered are listed, but only abnormal results are displayed) Labs Reviewed  COMPREHENSIVE METABOLIC PANEL - Abnormal; Notable for the following components:      Result Value   Glucose, Bld 102 (*)    Alkaline Phosphatase 29 (*)    GFR calc non Af Amer 56 (*)    All other components within normal limits  CBC - Abnormal; Notable for the following components:   RBC 4.20 (*)    Hemoglobin 12.6 (*)    All other components within normal limits  URINALYSIS, ROUTINE W REFLEX MICROSCOPIC - Abnormal; Notable for the following components:   Color, Urine AMBER (*)    APPearance HAZY (*)    All other components within normal limits  LIPASE, BLOOD    EKG None  Radiology CT Abdomen Pelvis W Contrast  Result Date: 09/22/2019 CLINICAL DATA:  RIGHT LOWER QUADRANT pain started yesterday. Pain after he eats with onset 15 minutes after eating. No nausea, vomiting, diarrhea. No fever or chills. EXAM: CT ABDOMEN AND PELVIS WITH CONTRAST TECHNIQUE: Multidetector CT imaging of the abdomen and pelvis was performed using the standard protocol following bolus administration of intravenous contrast. CONTRAST:  110mL OMNIPAQUE IOHEXOL 300 MG/ML  SOLN COMPARISON:  12/30/2014 FINDINGS: Lower chest: Lung bases are unremarkable. There is atherosclerotic calcification of coronary arteries. Median sternotomy. Hepatobiliary:  Cholecystectomy. Liver is unremarkable. No intra or extrahepatic biliary duct dilatation. Pancreas: Unremarkable. No pancreatic ductal dilatation or surrounding inflammatory changes. Spleen: Normal in size without focal abnormality. Adrenals/Urinary Tract: Adrenal glands are normal in appearance. Kidneys are normal. No hydronephrosis. Ureters are unremarkable. The bladder and visualized portion of the urethra are normal. Stomach/Bowel: There is significant diverticular disease of the sigmoid colon. No associated inflammatory changes to indicate acute diverticulitis. Moderate stool burden. The appendix is well seen and has a normal appearance. Vascular/Lymphatic: There is atherosclerotic calcification of the abdominal aorta. Infrarenal aneurysm is 3.8 centimeters. Although involved by atherosclerosis, there is vascular opacification of the celiac axis, superior mesenteric artery, and inferior mesenteric artery. Normal appearance of the portal venous system and inferior vena cava. Previously aneurysm measured maximum of 3.3 centimeters. No retroperitoneal or mesenteric adenopathy. Reproductive: Prostate is unremarkable. Other: No ascites. Anterior abdominal wall is unremarkable. Musculoskeletal: Mild degenerative changes at L4-5. IMPRESSION: 1. Significant sigmoid diverticulosis without acute diverticulitis. 2. Normal appendix. 3. Cholecystectomy. 4. A 3.8 centimeters infrarenal abdominal aortic aneurysm. Recommend followup by ultrasound in 2 years. This recommendation follows ACR consensus guidelines: White Paper of the ACR Incidental Findings Committee II on Vascular Findings. J Am Coll Radiol 2013; 10:789-794. 5. Aortic aneurysm NOS (ICD10-I71.9) 6. Aortic Atherosclerosis (ICD10-I70.0). Electronically Signed   By: Nolon Nations M.D.   On: 09/22/2019 16:43    Procedures Procedures (including critical care time)  Medications Ordered in ED Medications  iohexol (OMNIPAQUE) 300 MG/ML solution 100 mL (100 mLs  Intravenous Contrast Given 09/22/19 1618)    ED Course  I have reviewed the triage vital signs and the nursing notes.  Pertinent labs & imaging results that were available during my care of the patient were reviewed by me and considered in my medical decision making (see chart for details).    MDM Rules/Calculators/A&P                      6:17 PM Patient is awake, alert, sitting upright, speaking clearly.  He is now accompanied  by his wife.  We discussed all findings including generally reassuring CT, though with evidence for diverticulosis, but not acute appendicitis.  Labs similarly reassuring.  They both acknowledge importance of following up with primary care and return precautions. This adult male presents with sharp right lower quadrant pain.  Patient has history of multiple medical issues including abdominal aortic aneurysm, CT today generally reassuring, though there is persistent demonstration of this, though no indication for emergent intervention, nor for admission.  Patient is otherwise hemodynamically unremarkable, with a nonperitoneal abdomen, low suspicion for occult infection, or kidney stone.  Patient amenable to appropriate for discharge with close outpatient follow-up. Final Clinical Impression(s) / ED Diagnoses Final diagnoses:  Right lower quadrant abdominal pain      Gerhard Munch, MD 09/22/19 1818

## 2019-09-22 NOTE — ED Triage Notes (Signed)
Pt reports onset of RLQ pain yesterday, states pain only occurs after he eats with onset approx15 mins after eating and lasting about 30 mins. He denies n/v/d, urinary symptoms, fevers or chills.

## 2019-09-23 DIAGNOSIS — R1031 Right lower quadrant pain: Secondary | ICD-10-CM | POA: Diagnosis not present

## 2019-10-10 DIAGNOSIS — R0600 Dyspnea, unspecified: Secondary | ICD-10-CM | POA: Diagnosis not present

## 2019-10-10 DIAGNOSIS — I10 Essential (primary) hypertension: Secondary | ICD-10-CM | POA: Diagnosis not present

## 2019-10-10 DIAGNOSIS — I2510 Atherosclerotic heart disease of native coronary artery without angina pectoris: Secondary | ICD-10-CM | POA: Diagnosis not present

## 2019-10-13 NOTE — Progress Notes (Signed)
Cardiology Office Note:    Date:  10/14/2019   ID:  James Armstrong, DOB 08-Mar-1941, MRN [id number]  PCP:  Nicoletta Dress, MD  Cardiologist:  Shirlee More, MD    Referring MD: Nicoletta Dress, MD    ASSESSMENT:    1. Coronary artery disease involving native coronary artery of native heart with angina pectoris ([place])   2. Essential hypertension   3. Hyperlipidemia, unspecified hyperlipidemia type    PLAN:    In order of problems listed above:  1. Worsened New York Heart Association class II angina, check high-sensitivity troponin intensify medical therapy reassess in the office decide if repeat coronary angiography would be indicated as if he has progressive graft disease would be a candidate for redo bypass surgery 2. Poorly controlled add calcium channel blocker 3. Stable continue statin lipids are ideal   Next appointment: 4 weeks   Medication Adjustments/Labs and Tests Ordered: Current medicines are reviewed at length with the patient today.  Concerns regarding medicines are outlined above.  No orders of the defined types were placed in this encounter.  No orders of the defined types were placed in this encounter.   Chief Complaint  Patient presents with  . Follow-up    I am not doing as well  . Coronary Artery Disease    History of Present Illness:    James Armstrong is a 79 y.o. male with a hx of CAD with PCI / Resolute Drug Eluting Stent of the distal Left Main/Ostial LCF 08/09/16 for troponin normal unstable angina, Dyslipidemia, HTN, S/P CABG in 2003, CVA, with CEA and a small abdominal aneurysm  It was last last seen 07/07/2018 and now referred by his PCP for what he perceives as anginal equivalent shortness of breath.  And now referred for exertional shortness of breath anginal equivalent.. Compliance with diet, lifestyle and medications: Yes  Coronary angiography 07/26/2017 showed three-vessel disease following bypass surgery with all grafts being  patent.  The LAD was occluded in the midsegment with the distal vessel filled via left thoracic artery graft.  He has a stent to the left main coronary artery felt not to be fully deployed as a vein graft to the intermediate branch which fills the left circumflex and marginal.  Right coronary artery is occluded its mid segment and the vein graft to the distal right coronary artery is patent.  He traveled to [facility] with his daughter had to walk long distances climb up to 4 flights of stairs where they were staying walking on the beach and developed exertional shortness of breath and chest tightness he had to stop and rest and also took nitroglycerin one occasion.  Since he come home he has continued to have the symptoms but it is improved.  We discussed repeating coronary angiography and he is hesitant will do his intensify medical therapy increasing his oral mononitrates adding a calcium channel blocker with a systolic blood pressure of 160 and reassess in the office.  If unimproved he said that he will undergo coronary angiography and will do a high-sensitivity troponin today if elevated it would change our approach.  I reviewed the office note labs and EKG from his primary care physician  Coronary Diagrams I personally reviewed the coronary angiography films prior to the visit Diagnostic Dominance: Right   Past Medical History:  Diagnosis Date  . Abdominal aortic aneurysm (AAA) 30 to 34 mm in diameter ([place])   . Abdominal aortic aneurysm ([place]) 02/24/2016  . Asthma   .  Chest pain 02/22/2017  . Coronary artery disease involving native coronary artery of native heart with angina pectoris (HCC) 02/24/2016   CABG in 2004  coronary angiography done 08/09/16 Abnormal Diagnostic Summary Chronic Total Occlusion of the RCA Severe stenosis of the LM, LAD, Circumflex Patent LIMA graft to the mid LAD. Patent SVG graft to the Ramus coronary artery. Moderate disease of SVG graft to the Posterior descending  coronary artery. LV not done due to tortuosity of right subclavian Interventional Summary Successful PCI / Resolute Drug Eluting Stent of the distal Left Main/Ostial Circumflex Coronary Artery.      . Crohn disease (HCC)   . Essential hypertension 02/24/2016  . GERD (gastroesophageal reflux disease)   . Hx of CABG 02/24/2016   Overview:  In 2003  . Hyperlipemia 02/24/2016  . Shortness of breath 08/06/2016  . Sinus bradycardia 08/06/2016  . Stroke [facility])     Past Surgical History:  Procedure Laterality Date  . CARDIAC CATHETERIZATION    . CAROTID ENDARTERECTOMY    . CHOLECYSTECTOMY    . CORONARY ARTERY BYPASS GRAFT     2004  . HERNIA REPAIR    . LEFT HEART CATH AND CORS/GRAFTS ANGIOGRAPHY N/A 07/26/2017   Procedure: LEFT HEART CATH AND CORS/GRAFTS ANGIOGRAPHY;  Surgeon: Kathleene Hazel, MD;  Location: [facility] CV LAB;  Service: Cardiovascular;  Laterality: N/A;    Current Medications: Current Meds  Medication Sig  . Ascorbic Acid (VITAMIN C) 1000 MG tablet Take 2,000 mg by mouth daily.  Marland Kitchen aspirin EC 81 MG tablet Take 81 mg by mouth daily.  Marland Kitchen atorvastatin (LIPITOR) 80 MG tablet Take 80 mg by mouth daily.  . Cholecalciferol (VITAMIN D3 PO) Take 1 capsule by mouth daily.  . clopidogrel (PLAVIX) 75 MG tablet Take 75 mg by mouth daily.  Marland Kitchen escitalopram (LEXAPRO) 5 MG tablet Take 5 mg by mouth daily.  Marland Kitchen ezetimibe (ZETIA) 10 MG tablet Take 10 mg by mouth daily.  . fenofibrate 160 MG tablet Take 160 mg by mouth daily.  Marland Kitchen ibuprofen (ADVIL,MOTRIN) 200 MG tablet Take 400 mg by mouth every 6 (six) hours as needed for headache or moderate pain.  . isosorbide mononitrate (IMDUR) 30 MG 24 hr tablet Take 1 tablet (30 mg total) by mouth daily.  . metoprolol tartrate (LOPRESSOR) 50 MG tablet Take 0.5 tablets (25 mg total) by mouth daily.  . Multiple Vitamins-Minerals (MULTIVITAMIN PO) Take 1 tablet by mouth daily.  . nitroGLYCERIN (NITROSTAT) 0.4 MG SL tablet Place 1 tablet (0.4 mg total) under  the tongue every 5 (five) minutes as needed for chest pain.  . Omega-3 Fatty Acids (FISH OIL) 1000 MG CAPS Take 1 capsule by mouth 2 (two) times daily.  Marland Kitchen omeprazole (PRILOSEC) 20 MG capsule Take 40 mg by mouth at bedtime.     Allergies:   Ranexa [ranolazine]   Social History   Socioeconomic History  . Marital status: Single    Spouse name: Not on file  . Number of children: Not on file  . Years of education: Not on file  . Highest education level: Not on file  Occupational History  . Not on file  Tobacco Use  . Smoking status: Never Smoker  . Smokeless tobacco: Never Used  Substance and Sexual Activity  . Alcohol use: No  . Drug use: No  . Sexual activity: Not on file  Other Topics Concern  . Not on file  Social History Narrative  . Not on file   Social Determinants  of Health   Financial Resource Strain:   . Difficulty of Paying Living Expenses:   Food Insecurity:   . Worried About Programme researcher, broadcasting/film/video in the Last Year:   . Barista in the Last Year:   Transportation Needs:   . Freight forwarder (Medical):   Marland Kitchen Lack of Transportation (Non-Medical):   Physical Activity:   . Days of Exercise per Week:   . Minutes of Exercise per Session:   Stress:   . Feeling of Stress :   Social Connections:   . Frequency of Communication with Friends and Family:   . Frequency of Social Gatherings with Friends and Family:   . Attends Religious Services:   . Active Member of Clubs or Organizations:   . Attends Banker Meetings:   Marland Kitchen Marital Status:      Family History: The patient's family history includes Diabetes in his brother; Heart attack in his brother; Stroke in his mother. ROS:   Please see the history of present illness.    All other systems reviewed and are negative.  EKGs/Labs/Other Studies Reviewed:    The following studies were reviewed today:    Recent Labs:  08/27/2019 CBC normal hemoglobin 13.4 CMP creatinine 1.01 potassium 4.3  normal liver function test Cholesterol 146 triglyceride 137 HDL 38 LDL 84  EKG performed 10/10/2019 sinus rhythm first-degree heart block 60 bpm otherwise normal 09/22/2019: ALT 28; BUN 20; Creatinine, Ser 1.23; Hemoglobin 12.6; Platelets 239; Potassium 4.4; Sodium 139  Recent Lipid Panel    Component Value Date/Time   CHOL 173 06/19/2018 0805   TRIG 102 06/19/2018 0805   HDL 41 06/19/2018 0805   CHOLHDL 4.2 06/19/2018 0805   LDLCALC 112 (H) 06/19/2018 0805    Physical Exam:    VS:  BP (!) 160/62   Pulse 60   Ht 5\' 6"  (1.676 m)   Wt 155 lb (70.3 kg)   SpO2 98%   BMI 25.02 kg/m     Wt Readings from Last 3 Encounters:  10/14/19 155 lb (70.3 kg)  06/07/19 156 lb (70.8 kg)  04/29/19 156 lb 12.8 oz (71.1 kg)     GEN:  Well nourished, well developed in no acute distress HEENT: Normal NECK: No JVD; No carotid bruits LYMPHATICS: No lymphadenopathy CARDIAC: RRR, no murmurs, rubs, gallops RESPIRATORY:  Clear to auscultation without rales, wheezing or rhonchi  ABDOMEN: Soft, non-tender, non-distended MUSCULOSKELETAL:  No edema; No deformity  SKIN: Warm and dry NEUROLOGIC:  Alert and oriented x 3 PSYCHIATRIC:  Normal affect    Signed, 05/01/19, MD  10/14/2019 2:54 PM    [facility]

## 2019-10-14 ENCOUNTER — Encounter: Payer: Self-pay | Admitting: Cardiology

## 2019-10-14 ENCOUNTER — Ambulatory Visit: Payer: Medicare HMO | Admitting: Cardiology

## 2019-10-14 ENCOUNTER — Other Ambulatory Visit: Payer: Self-pay

## 2019-10-14 VITALS — BP 160/62 | HR 60 | Ht 66.0 in | Wt 155.0 lb

## 2019-10-14 DIAGNOSIS — I10 Essential (primary) hypertension: Secondary | ICD-10-CM | POA: Diagnosis not present

## 2019-10-14 DIAGNOSIS — R079 Chest pain, unspecified: Secondary | ICD-10-CM | POA: Diagnosis not present

## 2019-10-14 DIAGNOSIS — E785 Hyperlipidemia, unspecified: Secondary | ICD-10-CM

## 2019-10-14 DIAGNOSIS — I25119 Atherosclerotic heart disease of native coronary artery with unspecified angina pectoris: Secondary | ICD-10-CM

## 2019-10-14 MED ORDER — ISOSORBIDE MONONITRATE ER 60 MG PO TB24: 60.0000 mg | ORAL_TABLET | Freq: Every day | ORAL | 3 refills | Status: DC

## 2019-10-14 MED ORDER — AMLODIPINE BESYLATE 5 MG PO TABS: 5.0000 mg | ORAL_TABLET | Freq: Every day | ORAL | 3 refills | Status: DC

## 2019-10-14 NOTE — Addendum Note (Signed)
Addended by: Delorse Limber I on: 10/14/2019 03:01 PM   Modules accepted: Orders

## 2019-10-14 NOTE — Patient Instructions (Signed)
Medication Instructions:  Your physician has recommended you make the following change in your medication:  Start: Amlodipine 5mg  take one tablet by mouth daily.  Increase: Imdur 60mg  take one tablet by mouth daily.  *If you need a refill on your cardiac medications before your next appointment, please call your pharmacy*   Lab Work: Your physician recommends that you return for lab work in: TODAY Troponin If you have labs (blood work) drawn today and your tests are completely normal, you will receive your results only by: MyChart Message (if you have MyChart) OR . A paper copy in the mail If you have any lab test that is abnormal or we need to change your treatment, we will call you to review the results.   Testing/Procedures: None   Follow-Up: At [facility], you and your health needs are our priority.  As part of our continuing mission to provide you with exceptional heart care, we have created designated Provider Care Teams.  These Care Teams include your primary Cardiologist (physician) and Advanced Practice Providers (APPs -  Physician Assistants and Nurse Practitioners) who all work together to provide you with the care you need, when you need it.  We recommend signing up for the patient portal called "MyChart".  Sign up information is provided on this After Visit Summary.  MyChart is used to connect with patients for Virtual Visits (Telemedicine).  Patients are able to view lab/test results, encounter notes, upcoming appointments, etc.  Non-urgent messages can be sent to your provider as well.   To learn more about what you can do with MyChart, go to Marland Kitchen.    Your next appointment:   4 week(s)  The format for your next appointment:   In Person  Provider:   [facility], MD   Other Instructions

## 2019-10-15 LAB — TROPONIN T: Troponin T (Highly Sensitive): 9 ng/L (ref 0–22)

## 2019-10-18 ENCOUNTER — Telehealth: Payer: Self-pay | Admitting: Cardiology

## 2019-10-18 NOTE — Telephone Encounter (Signed)
Spoke with patient and let him know Dr. Hulen Shouts recommendation to stop this medication at this time. Patient verbalizes understanding and states that he will do this.    Encouraged patient to call back with any questions or concerns.

## 2019-10-18 NOTE — Telephone Encounter (Signed)
Best to stop

## 2019-10-18 NOTE — Telephone Encounter (Signed)
Pt c/o medication issue:  1. Name of Medication: amLODipine (NORVASC) 5 MG tablet  2. How are you currently taking this medication (dosage and times per day)? Once a day  3. Are you having a reaction (difficulty breathing--STAT)? no  4. What is your medication issue? Patient states he is having stomach pains and trouble sleeping since starting the medication. Please advise.

## 2019-10-24 ENCOUNTER — Telehealth: Payer: Self-pay | Admitting: Cardiology

## 2019-10-24 DIAGNOSIS — R42 Dizziness and giddiness: Secondary | ICD-10-CM | POA: Diagnosis not present

## 2019-10-24 NOTE — Telephone Encounter (Signed)
Would refer to his PCP 

## 2019-10-24 NOTE — Telephone Encounter (Signed)
Spoke with patient and let him know Dr. Hulen Shouts recommendation. He states that he is going to call Dr. Tomasa Blase. No other issues or concerns noted.    Encouraged patient to call back with any questions or concerns.

## 2019-10-24 NOTE — Telephone Encounter (Signed)
STAT if patient feels like he/she is going to faint   1) Are you dizzy now? A little bit  2) Do you feel faint or have you passed out? no  3) Do you have any other symptoms? no  4) Have you checked your HR and BP (record if available)? States his daughter checked it last time he was dizzy and it was fine.   Patient states he has been having dizzy spells this week. He states he is only a little dizzy now and does not feel like he is going to pass out. He states he has not checked his BP today, but last time his daughter checked it during a spell and he states it was normal. Please advise.

## 2019-11-05 NOTE — H&P (View-Only) (Signed)
Cardiology Office Note:    Date:  11/06/2019   ID:  James Armstrong, DOB 12/29/1940, MRN [id number]  PCP:  Schultz, Douglas E, MD  Cardiologist:  James Wickham, MD    Referring MD: Schultz, Douglas E, MD    ASSESSMENT:    1. Coronary artery disease involving native coronary artery of native heart with unstable angina pectoris (HCC)   2. Essential hypertension   3. Pure hypercholesterolemia    PLAN:    In order of problems listed above:  1. James Armstrong is unimproved on maximally tolerated medical therapy I will asked my interventional partner to review his films look at for any opportunity for further Cath Lab based procedures and if not refer him to the surgical colleagues for consideration of further interventions?  TM LR or even redo revascularization.  Now continue current medical treatment 2. Stable continue current treatment 3. Lipids at target continue lipid-lowering therapy statin and Zetia  Today I reviewed the case with Dr. Michael Armstrong who elected for coronary angiogram thinks the culprit is can be a saphenous vein graft to the right coronary artery advise referral for coronary angiography.  I reviewed with James Armstrong the risk and benefits he agrees and will be set up to undergo coronary angiography with Dr. Cooper for possible PCI Next appointment: 6 weeks   Medication Adjustments/Labs and Tests Ordered: Current medicines are reviewed at length with the patient today.  Concerns regarding medicines are outlined above.  Orders Placed This Encounter  Procedures  . ECHOCARDIOGRAM COMPLETE   No orders of the defined types were placed in this encounter.   No chief complaint on file.   History of Present Illness:    James Armstrong is a 78 y.o. male with a hx of  CAD with PCI / Resolute Drug Eluting Stent of the distal Left Main/Ostial LCF 08/09/16 for troponin normal unstable angina, Dyslipidemia, HTN, S/P CABG in 2003, CVA, with CEA and a small abdominal aneurysm  last seen  10/14/2019 with exercise intolerance and exertional shortness of breath.  His last coronary angiograms were 07/26/2017 showed three-vessel disease following bypass surgery with all grafts being patent.  Left anterior descending artery is occluded its mid section with the distal vessel filling from the left thoracic artery graft.  He had a stent to the left main coronary artery felt not to be fully deployed and a vein graft to the intermediate branch which fills the left circumflex and marginal.  Right coronary artery is occluded midsection and distal vessel is filled by the vein graft that is patent.. Compliance with diet, lifestyle and medications: Yes  James Armstrong is not doing well and is not pleased with the quality of his life activities like taking laundry out of the dryer caused him to have to stop because rest to recover and has exercise intolerance and shortness of breath.  Labs are normal his hemoglobin is normal thyroid studies are normal.  We intensify medical therapy for CAD adding oral nitrates to beta-blocker and calcium channel blocker and is unimproved.  In the past he was intolerant to ranolazine.  I will asked my interventional partner Dr. Cooper to look at his films see if there is any opportunity for further percutaneous revascularization and if not I will speak to our surgeons about either TM LR or potentially repeat revascularization.  He does not have edema orthopnea palpitations syncope and will check an echocardiogram to look at his ejection fraction Past Medical History:  Diagnosis Date  . Abdominal aortic   aneurysm (AAA) 30 to 34 mm in diameter (HCC)   . Abdominal aortic aneurysm (HCC) 02/24/2016  . Asthma   . Carotid stenosis, bilateral 09/25/2018  . Chest pain 02/22/2017  . Coronary artery disease involving native coronary artery of native heart with angina pectoris (HCC) 02/24/2016   CABG in 2004  coronary angiography done 08/09/16 Abnormal Diagnostic Summary Chronic Total Occlusion of  the RCA Severe stenosis of the LM, LAD, Circumflex Patent LIMA graft to the mid LAD. Patent SVG graft to the Ramus coronary artery. Moderate disease of SVG graft to the Posterior descending coronary artery. LV not done due to tortuosity of right subclavian Interventional Summary Successful PCI / Resolute Drug Eluting Stent of the distal Left Main/Ostial Circumflex Coronary Artery.      . Coronary artery disease involving native heart with unstable angina pectoris (HCC) 02/24/2016   CABG in 2004  coronary angiography done 08/09/16 Abnormal Diagnostic Summary Chronic Total Occlusion of the RCA Severe stenosis of the LM, LAD, Circumflex Patent LIMA graft to the mid LAD. Patent SVG graft to the Ramus coronary artery. Moderate disease of SVG graft to the Posterior descending coronary artery. LV not done due to tortuosity of right subclavian Interventional Summary Successful PCI /  . Crohn disease (HCC)   . Essential hypertension 02/24/2016  . GERD (gastroesophageal reflux disease)   . Hx of CABG 02/24/2016   Overview:  In 2003  . Hyperlipemia 02/24/2016  . Mixed dyslipidemia 07/15/2017  . Multiple vessel coronary artery disease 07/15/2017  . Myopathy 02/01/2018  . Presence of stent in coronary artery in patient with coronary artery disease 07/15/2017  . Shortness of breath 08/06/2016  . Sinus bradycardia 08/06/2016  . Stroke [facility])     Past Surgical History:  Procedure Laterality Date  . CARDIAC CATHETERIZATION    . CAROTID ENDARTERECTOMY    . CHOLECYSTECTOMY    . CORONARY ARTERY BYPASS GRAFT     2004  . HERNIA REPAIR    . LEFT HEART CATH AND CORS/GRAFTS ANGIOGRAPHY N/A 07/26/2017   Procedure: LEFT HEART CATH AND CORS/GRAFTS ANGIOGRAPHY;  Surgeon: Kathleene Hazel, MD;  Location: [facility] INVASIVE CV LAB;  Service: Cardiovascular;  Laterality: N/A;    Current Medications: Current Meds  Medication Sig  . amLODipine (NORVASC) 5 MG tablet Take 1 tablet (5 mg total) by mouth daily.  . Ascorbic Acid  (VITAMIN C) 1000 MG tablet Take 2,000 mg by mouth daily.  Marland Kitchen aspirin EC 81 MG tablet Take 81 mg by mouth daily.  Marland Kitchen atorvastatin (LIPITOR) 80 MG tablet Take 80 mg by mouth daily.  . Cholecalciferol (VITAMIN D3 PO) Take 1 capsule by mouth daily.  . clopidogrel (PLAVIX) 75 MG tablet Take 75 mg by mouth daily.  Marland Kitchen escitalopram (LEXAPRO) 5 MG tablet Take 5 mg by mouth daily.  Marland Kitchen ezetimibe (ZETIA) 10 MG tablet Take 10 mg by mouth daily.  . fenofibrate 160 MG tablet Take 160 mg by mouth daily.  Marland Kitchen ibuprofen (ADVIL,MOTRIN) 200 MG tablet Take 400 mg by mouth every 6 (six) hours as needed for headache or moderate pain.  . isosorbide mononitrate (IMDUR) 60 MG 24 hr tablet Take 1 tablet (60 mg total) by mouth daily.  . metoprolol tartrate (LOPRESSOR) 50 MG tablet Take 0.5 tablets (25 mg total) by mouth daily.  . Multiple Vitamins-Minerals (MULTIVITAMIN PO) Take 1 tablet by mouth daily.  . nitroGLYCERIN (NITROSTAT) 0.4 MG SL tablet Place 1 tablet (0.4 mg total) under the tongue every 5 (five) minutes as needed  for chest pain.  . Omega-3 Fatty Acids (FISH OIL) 1000 MG CAPS Take 1 capsule by mouth 2 (two) times daily.  Marland Kitchen omeprazole (PRILOSEC) 20 MG capsule Take 40 mg by mouth at bedtime.     Allergies:   Ranexa [ranolazine]   Social History   Socioeconomic History  . Marital status: Single    Spouse name: Not on file  . Number of children: Not on file  . Years of education: Not on file  . Highest education level: Not on file  Occupational History  . Not on file  Tobacco Use  . Smoking status: Never Smoker  . Smokeless tobacco: Never Used  Substance and Sexual Activity  . Alcohol use: No  . Drug use: No  . Sexual activity: Not on file  Other Topics Concern  . Not on file  Social History Narrative  . Not on file   Social Determinants of Health   Financial Resource Strain:   . Difficulty of Paying Living Expenses:   Food Insecurity:   . Worried About Charity fundraiser in the Last Year:   .  Arboriculturist in the Last Year:   Transportation Needs:   . Film/video editor (Medical):   Marland Kitchen Lack of Transportation (Non-Medical):   Physical Activity:   . Days of Exercise per Week:   . Minutes of Exercise per Session:   Stress:   . Feeling of Stress :   Social Connections:   . Frequency of Communication with Friends and Family:   . Frequency of Social Gatherings with Friends and Family:   . Attends Religious Services:   . Active Member of Clubs or Organizations:   . Attends Archivist Meetings:   Marland Kitchen Marital Status:      Family History: The patient's family history includes Diabetes in his brother; Heart attack in his brother; Stroke in his mother. ROS:   Please see the history of present illness.    All other systems reviewed and are negative.  EKGs/Labs/Other Studies Reviewed:    The following studies were reviewed today:    Recent Labs: 09/22/2019: ALT 28; BUN 20; Creatinine, Ser 1.23; Hemoglobin 12.6; Platelets 239; Potassium 4.4; Sodium 139  Recent Lipid Panel    Component Value Date/Time   CHOL 173 06/19/2018 0805   TRIG 102 06/19/2018 0805   HDL 41 06/19/2018 0805   CHOLHDL 4.2 06/19/2018 0805   LDLCALC 112 (H) 06/19/2018 0805    Physical Exam:    VS:  BP (!) 130/58   Pulse 60   Temp (!) 97 F (36.1 C)   Ht 5\' 6"  (1.676 m)   Wt 154 lb 1.3 oz (69.9 kg)   SpO2 100%   BMI 24.87 kg/m     Wt Readings from Last 3 Encounters:  11/06/19 154 lb 1.3 oz (69.9 kg)  10/14/19 155 lb (70.3 kg)  06/07/19 156 lb (70.8 kg)     GEN:  Well nourished, well developed in no acute distress HEENT: Normal NECK: No JVD; No carotid bruits LYMPHATICS: No lymphadenopathy CARDIAC: RRR, no murmurs, rubs, gallops RESPIRATORY:  Clear to auscultation without rales, wheezing or rhonchi  ABDOMEN: Soft, non-tender, non-distended MUSCULOSKELETAL:  No edema; No deformity  SKIN: Warm and dry NEUROLOGIC:  Alert and oriented x 3 PSYCHIATRIC:  Normal affect     Signed, Shirlee More, MD  11/06/2019 9:22 AM    [facility]

## 2019-11-05 NOTE — Progress Notes (Addendum)
Cardiology Office Note:    Date:  11/06/2019   ID:  James Armstrong, DOB April 19, 1941, MRN [id number]  PCP:  James Fusi, MD  Cardiologist:  James Herrlich, MD    Referring MD: James Fusi, MD    ASSESSMENT:    1. Coronary artery disease involving native coronary artery of native heart with unstable angina pectoris (HCC)   2. Essential hypertension   3. Pure hypercholesterolemia    PLAN:    In order of problems listed above:  1. James Armstrong is unimproved on maximally tolerated medical therapy I will asked my interventional partner to review his films look at for any opportunity for further Cath Lab based procedures and if not refer him to the surgical colleagues for consideration of further interventions?  TM LR or even redo revascularization.  Now continue current medical treatment 2. Stable continue current treatment 3. Lipids at target continue lipid-lowering therapy statin and Zetia  Today I reviewed the case with Dr. Tonny Armstrong who elected for coronary angiogram thinks the culprit is can be a saphenous vein graft to the right coronary artery advise referral for coronary angiography.  I reviewed with James Armstrong the risk and benefits he agrees and will be set up to undergo coronary angiography with James Armstrong for possible PCI Next appointment: 6 weeks   Medication Adjustments/Labs and Tests Ordered: Current medicines are reviewed at length with the patient today.  Concerns regarding medicines are outlined above.  Orders Placed This Encounter  Procedures  . ECHOCARDIOGRAM COMPLETE   No orders of the defined types were placed in this encounter.   No chief complaint on file.   History of Present Illness:    James Armstrong is a 79 y.o. male with a hx of  CAD with PCI / Resolute Drug Eluting Stent of the distal Left Main/Ostial LCF 08/09/16 for troponin normal unstable angina, Dyslipidemia, HTN, S/P CABG in 2003, CVA, with CEA and a small abdominal aneurysm  last seen  10/14/2019 with exercise intolerance and exertional shortness of breath.  His last coronary angiograms were 07/26/2017 showed three-vessel disease following bypass surgery with all grafts being patent.  Left anterior descending artery is occluded its mid section with the distal vessel filling from the left thoracic artery graft.  He had a stent to the left main coronary artery felt not to be fully deployed and a vein graft to the intermediate branch which fills the left circumflex and marginal.  Right coronary artery is occluded midsection and distal vessel is filled by the vein graft that is patent.. Compliance with diet, lifestyle and medications: Yes  [name] is not doing well and is not pleased with the quality of his life activities like taking laundry out of the dryer caused him to have to stop because rest to recover and has exercise intolerance and shortness of breath.  Labs are normal his hemoglobin is normal thyroid studies are normal.  We intensify medical therapy for CAD adding oral nitrates to beta-blocker and calcium channel blocker and is unimproved.  In the past he was intolerant to ranolazine.  I will asked my interventional partner James Armstrong to look at his films see if there is any opportunity for further percutaneous revascularization and if not I will speak to our surgeons about either TM LR or potentially repeat revascularization.  He does not have edema orthopnea palpitations syncope and will check an echocardiogram to look at his ejection fraction Past Medical History:  Diagnosis Date  . Abdominal aortic  aneurysm (AAA) 30 to 34 mm in diameter (HCC)   . Abdominal aortic aneurysm (HCC) 02/24/2016  . Asthma   . Carotid stenosis, bilateral 09/25/2018  . Chest pain 02/22/2017  . Coronary artery disease involving native coronary artery of native heart with angina pectoris (HCC) 02/24/2016   CABG in 2004  coronary angiography done 08/09/16 Abnormal Diagnostic Summary Chronic Total Occlusion of  the RCA Severe stenosis of the LM, LAD, Circumflex Patent LIMA graft to the mid LAD. Patent SVG graft to the Ramus coronary artery. Moderate disease of SVG graft to the Posterior descending coronary artery. LV not done due to tortuosity of right subclavian Interventional Summary Successful PCI / Resolute Drug Eluting Stent of the distal Left Main/Ostial Circumflex Coronary Artery.      . Coronary artery disease involving native heart with unstable angina pectoris (HCC) 02/24/2016   CABG in 2004  coronary angiography done 08/09/16 Abnormal Diagnostic Summary Chronic Total Occlusion of the RCA Severe stenosis of the LM, LAD, Circumflex Patent LIMA graft to the mid LAD. Patent SVG graft to the Ramus coronary artery. Moderate disease of SVG graft to the Posterior descending coronary artery. LV not done due to tortuosity of right subclavian Interventional Summary Successful PCI /  . Crohn disease (HCC)   . Essential hypertension 02/24/2016  . GERD (gastroesophageal reflux disease)   . Hx of CABG 02/24/2016   Overview:  In 2003  . Hyperlipemia 02/24/2016  . Mixed dyslipidemia 07/15/2017  . Multiple vessel coronary artery disease 07/15/2017  . Myopathy 02/01/2018  . Presence of stent in coronary artery in patient with coronary artery disease 07/15/2017  . Shortness of breath 08/06/2016  . Sinus bradycardia 08/06/2016  . Stroke [facility])     Past Surgical History:  Procedure Laterality Date  . CARDIAC CATHETERIZATION    . CAROTID ENDARTERECTOMY    . CHOLECYSTECTOMY    . CORONARY ARTERY BYPASS GRAFT     2004  . HERNIA REPAIR    . LEFT HEART CATH AND CORS/GRAFTS ANGIOGRAPHY N/A 07/26/2017   Procedure: LEFT HEART CATH AND CORS/GRAFTS ANGIOGRAPHY;  Surgeon: Kathleene Hazel, MD;  Location: [facility] INVASIVE CV LAB;  Service: Cardiovascular;  Laterality: N/A;    Current Medications: Current Meds  Medication Sig  . amLODipine (NORVASC) 5 MG tablet Take 1 tablet (5 mg total) by mouth daily.  . Ascorbic Acid  (VITAMIN C) 1000 MG tablet Take 2,000 mg by mouth daily.  Marland Kitchen aspirin EC 81 MG tablet Take 81 mg by mouth daily.  Marland Kitchen atorvastatin (LIPITOR) 80 MG tablet Take 80 mg by mouth daily.  . Cholecalciferol (VITAMIN D3 PO) Take 1 capsule by mouth daily.  . clopidogrel (PLAVIX) 75 MG tablet Take 75 mg by mouth daily.  Marland Kitchen escitalopram (LEXAPRO) 5 MG tablet Take 5 mg by mouth daily.  Marland Kitchen ezetimibe (ZETIA) 10 MG tablet Take 10 mg by mouth daily.  . fenofibrate 160 MG tablet Take 160 mg by mouth daily.  Marland Kitchen ibuprofen (ADVIL,MOTRIN) 200 MG tablet Take 400 mg by mouth every 6 (six) hours as needed for headache or moderate pain.  . isosorbide mononitrate (IMDUR) 60 MG 24 hr tablet Take 1 tablet (60 mg total) by mouth daily.  . metoprolol tartrate (LOPRESSOR) 50 MG tablet Take 0.5 tablets (25 mg total) by mouth daily.  . Multiple Vitamins-Minerals (MULTIVITAMIN PO) Take 1 tablet by mouth daily.  . nitroGLYCERIN (NITROSTAT) 0.4 MG SL tablet Place 1 tablet (0.4 mg total) under the tongue every 5 (five) minutes as needed  for chest pain.  . Omega-3 Fatty Acids (FISH OIL) 1000 MG CAPS Take 1 capsule by mouth 2 (two) times daily.  Marland Kitchen omeprazole (PRILOSEC) 20 MG capsule Take 40 mg by mouth at bedtime.     Allergies:   Ranexa [ranolazine]   Social History   Socioeconomic History  . Marital status: Single    Spouse name: Not on file  . Number of children: Not on file  . Years of education: Not on file  . Highest education level: Not on file  Occupational History  . Not on file  Tobacco Use  . Smoking status: Never Smoker  . Smokeless tobacco: Never Used  Substance and Sexual Activity  . Alcohol use: No  . Drug use: No  . Sexual activity: Not on file  Other Topics Concern  . Not on file  Social History Narrative  . Not on file   Social Determinants of Health   Financial Resource Strain:   . Difficulty of Paying Living Expenses:   Food Insecurity:   . Worried About Charity fundraiser in the Last Year:   .  Arboriculturist in the Last Year:   Transportation Needs:   . Film/video editor (Medical):   Marland Kitchen Lack of Transportation (Non-Medical):   Physical Activity:   . Days of Exercise per Week:   . Minutes of Exercise per Session:   Stress:   . Feeling of Stress :   Social Connections:   . Frequency of Communication with Friends and Family:   . Frequency of Social Gatherings with Friends and Family:   . Attends Religious Services:   . Active Member of Clubs or Organizations:   . Attends Archivist Meetings:   Marland Kitchen Marital Status:      Family History: The patient's family history includes Diabetes in his brother; Heart attack in his brother; Stroke in his mother. ROS:   Please see the history of present illness.    All other systems reviewed and are negative.  EKGs/Labs/Other Studies Reviewed:    The following studies were reviewed today:    Recent Labs: 09/22/2019: ALT 28; BUN 20; Creatinine, Ser 1.23; Hemoglobin 12.6; Platelets 239; Potassium 4.4; Sodium 139  Recent Lipid Panel    Component Value Date/Time   CHOL 173 06/19/2018 0805   TRIG 102 06/19/2018 0805   HDL 41 06/19/2018 0805   CHOLHDL 4.2 06/19/2018 0805   LDLCALC 112 (H) 06/19/2018 0805    Physical Exam:    VS:  BP (!) 130/58   Pulse 60   Temp (!) 97 F (36.1 C)   Ht 5\' 6"  (1.676 m)   Wt 154 lb 1.3 oz (69.9 kg)   SpO2 100%   BMI 24.87 kg/m     Wt Readings from Last 3 Encounters:  11/06/19 154 lb 1.3 oz (69.9 kg)  10/14/19 155 lb (70.3 kg)  06/07/19 156 lb (70.8 kg)     GEN:  Well nourished, well developed in no acute distress HEENT: Normal NECK: No JVD; No carotid bruits LYMPHATICS: No lymphadenopathy CARDIAC: RRR, no murmurs, rubs, gallops RESPIRATORY:  Clear to auscultation without rales, wheezing or rhonchi  ABDOMEN: Soft, non-tender, non-distended MUSCULOSKELETAL:  No edema; No deformity  SKIN: Warm and dry NEUROLOGIC:  Alert and oriented x 3 PSYCHIATRIC:  Normal affect     Signed, Shirlee More, MD  11/06/2019 9:22 AM    [facility]

## 2019-11-06 ENCOUNTER — Other Ambulatory Visit: Payer: Self-pay

## 2019-11-06 ENCOUNTER — Encounter: Payer: Self-pay | Admitting: Cardiology

## 2019-11-06 ENCOUNTER — Ambulatory Visit: Payer: Medicare HMO | Admitting: Cardiology

## 2019-11-06 VITALS — BP 130/58 | HR 60 | Temp 97.0°F | Ht 66.0 in | Wt 154.1 lb

## 2019-11-06 DIAGNOSIS — E7800 Pure hypercholesterolemia, unspecified: Secondary | ICD-10-CM

## 2019-11-06 DIAGNOSIS — I25110 Atherosclerotic heart disease of native coronary artery with unstable angina pectoris: Secondary | ICD-10-CM

## 2019-11-06 DIAGNOSIS — I10 Essential (primary) hypertension: Secondary | ICD-10-CM | POA: Diagnosis not present

## 2019-11-06 NOTE — Patient Instructions (Signed)
Medication Instructions:  Your physician recommends that you continue on your current medications as directed. Please refer to the Current Medication list given to you today.  *If you need a refill on your cardiac medications before your next appointment, please call your pharmacy*   Lab Work: None If you have labs (blood work) drawn today and your tests are completely normal, you will receive your results only by: Marland Kitchen MyChart Message (if you have MyChart) OR . A paper copy in the mail If you have any lab test that is abnormal or we need to change your treatment, we will call you to review the results.   Testing/Procedures: Your physician has requested that you have an echocardiogram. Echocardiography is a painless test that uses sound waves to create images of your heart. It provides your doctor with information about the size and shape of your heart and how well your heart's chambers and valves are working. This procedure takes approximately one hour. There are no restrictions for this procedure.     Follow-Up: At [facility], you and your health needs are our priority.  As part of our continuing mission to provide you with exceptional heart care, we have created designated Provider Care Teams.  These Care Teams include your primary Cardiologist (physician) and Advanced Practice Providers (APPs -  Physician Assistants and Nurse Practitioners) who all work together to provide you with the care you need, when you need it.  We recommend signing up for the patient portal called "MyChart".  Sign up information is provided on this After Visit Summary.  MyChart is used to connect with patients for Virtual Visits (Telemedicine).  Patients are able to view lab/test results, encounter notes, upcoming appointments, etc.  Non-urgent messages can be sent to your provider as well.   To learn more about what you can do with MyChart, go to [email or web address].    Your next appointment:   6  week(s)  The format for your next appointment:   In Person in [place]  Provider:   Norman Herrlich, MD   Other Instructions

## 2019-11-07 ENCOUNTER — Telehealth: Payer: Self-pay

## 2019-11-07 NOTE — Addendum Note (Signed)
Addended by: Norman Herrlich on: 11/07/2019 02:41 PM   Modules accepted: Level of Service

## 2019-11-07 NOTE — Addendum Note (Signed)
Addended by: Norman Herrlich on: 11/07/2019 02:41 PM   Modules accepted: Orders, SmartSet

## 2019-11-07 NOTE — Telephone Encounter (Signed)
Spoke to this patient just now and let him know that his heart catheterization is scheduled for 11/19/19. I went over all of the instructions for this procedure with him over the phone and am mailing them to his house as well. We scheduled a COVID swab together while we were on the phone so he is aware of when/where to go for this swab as well. No other issues or concerns were noted.    Encouraged patient to call back with any questions or concerns.

## 2019-11-07 NOTE — Progress Notes (Signed)
Spoke to this patient just now and let him know that his heart catheterization is scheduled for 11/19/19. I went over all of the instructions for this procedure with him over the phone and am mailing them to his house as well. We scheduled a COVID swab together while we were on the phone so he is aware of when/where to go for this swab as well. No other issues or concerns were noted.    Encouraged patient to call back with any questions or concerns. 

## 2019-11-08 ENCOUNTER — Telehealth: Payer: Self-pay

## 2019-11-08 ENCOUNTER — Ambulatory Visit (HOSPITAL_BASED_OUTPATIENT_CLINIC_OR_DEPARTMENT_OTHER)
Admission: RE | Admit: 2019-11-08 | Discharge: 2019-11-08 | Disposition: A | Discharge status: Home / Self Care | Payer: Medicare HMO | Source: Ambulatory Visit | Attending: Cardiology | Admitting: Cardiology

## 2019-11-08 ENCOUNTER — Other Ambulatory Visit: Payer: Self-pay

## 2019-11-08 DIAGNOSIS — I10 Essential (primary) hypertension: Secondary | ICD-10-CM | POA: Insufficient documentation

## 2019-11-08 NOTE — Telephone Encounter (Signed)
-----   Message from Baldo Daub, MD sent at 11/08/2019  2:15 PM EDT ----- Normal or stable result  Good result his heart muscle function is normal I think he will benefit from leg and he should be scheduled for coronary angiogram

## 2019-11-08 NOTE — Telephone Encounter (Signed)
Spoke with patient regarding results and recommendation.  Patient verbalizes understanding and is agreeable to plan of care. Advised patient to call back with any issues or concerns.  

## 2019-11-08 NOTE — Progress Notes (Signed)
  Echocardiogram 2D Echocardiogram has been performed.  James Armstrong  11/08/2019, 1:43 PM

## 2019-11-12 ENCOUNTER — Telehealth: Payer: Self-pay | Admitting: Cardiology

## 2019-11-12 NOTE — Telephone Encounter (Signed)
Spoke to the patient and let him know that they will allow one person in the waiting room to wait on him.  He verbalizes understanding. No other issues or concerns were noted.

## 2019-11-12 NOTE — Telephone Encounter (Signed)
New message   Patient has question about if his wife can be in the waiting room during his cath procedure. Please advise.

## 2019-11-15 ENCOUNTER — Other Ambulatory Visit (HOSPITAL_COMMUNITY)
Admission: RE | Admit: 2019-11-15 | Discharge: 2019-11-15 | Disposition: A | Discharge status: Home / Self Care | Payer: Medicare HMO | Source: Ambulatory Visit | Attending: Cardiovascular Disease | Admitting: Cardiovascular Disease

## 2019-11-15 DIAGNOSIS — Z20822 Contact with and (suspected) exposure to covid-19: Secondary | ICD-10-CM | POA: Diagnosis not present

## 2019-11-15 DIAGNOSIS — Z01812 Encounter for preprocedural laboratory examination: Secondary | ICD-10-CM | POA: Insufficient documentation

## 2019-11-15 LAB — SARS CORONAVIRUS 2 (TAT 6-24 HRS): SARS Coronavirus 2: NEGATIVE

## 2019-11-18 ENCOUNTER — Telehealth: Payer: Self-pay

## 2019-11-18 ENCOUNTER — Telehealth: Payer: Self-pay | Admitting: *Deleted

## 2019-11-18 DIAGNOSIS — I714 Abdominal aortic aneurysm, without rupture, unspecified: Secondary | ICD-10-CM

## 2019-11-18 DIAGNOSIS — I25110 Atherosclerotic heart disease of native coronary artery with unstable angina pectoris: Secondary | ICD-10-CM

## 2019-11-18 LAB — BASIC METABOLIC PANEL
BUN/Creatinine Ratio: 17 (ref 10–24)
BUN: 19 mg/dL (ref 8–27)
CO2: 27 mmol/L (ref 20–29)
Calcium: 9.5 mg/dL (ref 8.6–10.2)
Chloride: 104 mmol/L (ref 96–106)
Creatinine, Ser: 1.1 mg/dL (ref 0.76–1.27)
GFR calc Af Amer: 74 mL/min/{1.73_m2} (ref >59)
GFR calc non Af Amer: 64 mL/min/{1.73_m2} (ref >59)
Glucose: 117 mg/dL — ABNORMAL HIGH (ref 65–99)
Potassium: 4.4 mmol/L (ref 3.5–5.2)
Sodium: 139 mmol/L (ref 134–144)

## 2019-11-18 LAB — CBC
Hematocrit: 32.1 % — ABNORMAL LOW (ref 37.5–51.0)
Hemoglobin: 10.5 g/dL — ABNORMAL LOW (ref 13.0–17.7)
MCH: 29.6 pg (ref 26.6–33.0)
MCHC: 32.7 g/dL (ref 31.5–35.7)
MCV: 90 fL (ref 79–97)
Platelets: 231 10*3/uL (ref 150–450)
RBC: 3.55 x10E6/uL — ABNORMAL LOW (ref 4.14–5.80)
RDW: 13.7 % (ref 11.6–15.4)
WBC: 5.4 10*3/uL (ref 3.4–10.8)

## 2019-11-18 NOTE — Telephone Encounter (Signed)
-----   Message from Baldo Daub, MD sent at 11/18/2019  1:47 PM EDT ----- Stable for coronary angiography

## 2019-11-18 NOTE — Telephone Encounter (Signed)
Spoke with patient regarding results and recommendation.  Patient verbalizes understanding and is agreeable to plan of care. Advised patient to call back with any issues or concerns.  

## 2019-11-18 NOTE — Telephone Encounter (Signed)
Pt contacted pre-catheterization scheduled at [facility] for: Tuesday Nov 19, 2019 7:30 AM Verified arrival time and place: [facility] Main Entrance A [facility]) at: 5:30 AM   No solid food after midnight prior to cath, clear liquids until 5 AM day of procedure.   AM meds can be  taken pre-cath with sip of water including: ASA 81 mg Plavix 75 mg  Confirmed patient has responsible adult to drive home post procedure and observe 24 hours after arriving home: yes  You are allowed ONE visitor in the waiting room during your procedure. Both you and your visitor must wear masks.      COVID-19 Pre-Screening Questions:  . In the past 7 to 10 days have you had a cough,  shortness of breath, headache, congestion, fever (100 or greater) body aches, chills, sore throat, or sudden loss of taste or sense of smell? no . Have you been around anyone with known Covid 19 in the past 7 to 10 days? no . Have you been around anyone who is awaiting Covid 19 test results in the past 7 to 10 days? no . Have you been around anyone who has mentioned symptoms of Covid 19 within the past 7 to 10 days? No  Reviewed procedure/mask/visitor instructions, COVID-19 screening questions with patient.

## 2019-11-18 NOTE — Telephone Encounter (Signed)
Spoke to the patient just now and let him know that he would need to have some lab work done today so that he could have his cardiac cath done tomorrow. He verbalizes understanding and states that he will be coming to our office in just a little bit to have these labs done. No other issues or concerns were noted at this time.

## 2019-11-19 ENCOUNTER — Ambulatory Visit (HOSPITAL_COMMUNITY)
Admission: RE | Admit: 2019-11-19 | Discharge: 2019-11-19 | Disposition: A | Discharge status: Home / Self Care | Payer: Medicare HMO | Attending: Cardiovascular Disease | Admitting: Cardiovascular Disease

## 2019-11-19 ENCOUNTER — Other Ambulatory Visit: Payer: Self-pay

## 2019-11-19 ENCOUNTER — Encounter (HOSPITAL_COMMUNITY)
Admission: RE | Disposition: A | Discharge status: Home / Self Care | Payer: Self-pay | Source: Home / Self Care | Attending: Cardiovascular Disease

## 2019-11-19 DIAGNOSIS — Z7982 Long term (current) use of aspirin: Secondary | ICD-10-CM | POA: Insufficient documentation

## 2019-11-19 DIAGNOSIS — E785 Hyperlipidemia, unspecified: Secondary | ICD-10-CM | POA: Diagnosis present

## 2019-11-19 DIAGNOSIS — Z79899 Other long term (current) drug therapy: Secondary | ICD-10-CM | POA: Insufficient documentation

## 2019-11-19 DIAGNOSIS — E782 Mixed hyperlipidemia: Secondary | ICD-10-CM | POA: Diagnosis not present

## 2019-11-19 DIAGNOSIS — Z955 Presence of coronary angioplasty implant and graft: Secondary | ICD-10-CM

## 2019-11-19 DIAGNOSIS — I10 Essential (primary) hypertension: Secondary | ICD-10-CM

## 2019-11-19 DIAGNOSIS — Z8673 Personal history of transient ischemic attack (TIA), and cerebral infarction without residual deficits: Secondary | ICD-10-CM | POA: Insufficient documentation

## 2019-11-19 DIAGNOSIS — Z7902 Long term (current) use of antithrombotics/antiplatelets: Secondary | ICD-10-CM | POA: Insufficient documentation

## 2019-11-19 DIAGNOSIS — I714 Abdominal aortic aneurysm, without rupture, unspecified: Secondary | ICD-10-CM | POA: Diagnosis present

## 2019-11-19 DIAGNOSIS — K219 Gastro-esophageal reflux disease without esophagitis: Secondary | ICD-10-CM | POA: Insufficient documentation

## 2019-11-19 DIAGNOSIS — I25110 Atherosclerotic heart disease of native coronary artery with unstable angina pectoris: Secondary | ICD-10-CM

## 2019-11-19 DIAGNOSIS — I25710 Atherosclerosis of autologous vein coronary artery bypass graft(s) with unstable angina pectoris: Secondary | ICD-10-CM

## 2019-11-19 DIAGNOSIS — I25700 Atherosclerosis of coronary artery bypass graft(s), unspecified, with unstable angina pectoris: Secondary | ICD-10-CM | POA: Insufficient documentation

## 2019-11-19 DIAGNOSIS — I6523 Occlusion and stenosis of bilateral carotid arteries: Secondary | ICD-10-CM | POA: Diagnosis not present

## 2019-11-19 DIAGNOSIS — K5090 Crohn's disease, unspecified, without complications: Secondary | ICD-10-CM | POA: Insufficient documentation

## 2019-11-19 DIAGNOSIS — E7800 Pure hypercholesterolemia, unspecified: Secondary | ICD-10-CM | POA: Insufficient documentation

## 2019-11-19 DIAGNOSIS — J45909 Unspecified asthma, uncomplicated: Secondary | ICD-10-CM | POA: Insufficient documentation

## 2019-11-19 DIAGNOSIS — Z951 Presence of aortocoronary bypass graft: Secondary | ICD-10-CM | POA: Diagnosis not present

## 2019-11-19 HISTORY — PX: LEFT HEART CATH AND CORS/GRAFTS ANGIOGRAPHY: CATH118250

## 2019-11-19 HISTORY — PX: CORONARY STENT INTERVENTION: CATH118234

## 2019-11-19 LAB — POCT ACTIVATED CLOTTING TIME: Activated Clotting Time: 439 seconds

## 2019-11-19 SURGERY — LEFT HEART CATH AND CORS/GRAFTS ANGIOGRAPHY
Anesthesia: LOCAL

## 2019-11-19 MED ORDER — HEPARIN (PORCINE) IN NACL 1000-0.9 UT/500ML-% IV SOLN
INTRAVENOUS | Status: AC
  Filled 2019-11-19: qty 500

## 2019-11-19 MED ORDER — IOHEXOL 350 MG/ML SOLN
INTRAVENOUS | Status: AC
  Filled 2019-11-19: qty 1

## 2019-11-19 MED ORDER — NITROGLYCERIN 1 MG/10 ML FOR IR/CATH LAB
INTRA_ARTERIAL | Status: DC | PRN
  Administered 2019-11-19: 200 ug

## 2019-11-19 MED ORDER — LABETALOL HCL 5 MG/ML IV SOLN: 10.0000 mg | INTRAVENOUS | Status: AC | PRN

## 2019-11-19 MED ORDER — FENTANYL CITRATE (PF) 100 MCG/2ML IJ SOLN
INTRAMUSCULAR | Status: DC | PRN
  Administered 2019-11-19: 25 ug via INTRAVENOUS

## 2019-11-19 MED ORDER — HYDRALAZINE HCL 20 MG/ML IJ SOLN: 10.0000 mg | INTRAMUSCULAR | Status: AC | PRN

## 2019-11-19 MED ORDER — HEPARIN (PORCINE) IN NACL 1000-0.9 UT/500ML-% IV SOLN
INTRAVENOUS | Status: DC | PRN
  Administered 2019-11-19 (×2): 500 mL

## 2019-11-19 MED ORDER — SODIUM CHLORIDE 0.9 % WEIGHT BASED INFUSION: 1.0000 mL/kg/h | INTRAVENOUS | Status: DC

## 2019-11-19 MED ORDER — LIDOCAINE-EPINEPHRINE 1 %-1:100000 IJ SOLN
INTRAMUSCULAR | Status: AC
  Filled 2019-11-19: qty 1

## 2019-11-19 MED ORDER — ASPIRIN 81 MG PO CHEW: 81.0000 mg | CHEWABLE_TABLET | ORAL | Status: DC

## 2019-11-19 MED ORDER — ONDANSETRON HCL 4 MG/2ML IJ SOLN: 4.0000 mg | Freq: Four times a day (QID) | INTRAMUSCULAR | Status: DC | PRN

## 2019-11-19 MED ORDER — MIDAZOLAM HCL 2 MG/2ML IJ SOLN
INTRAMUSCULAR | Status: DC | PRN
  Administered 2019-11-19: 2 mg via INTRAVENOUS

## 2019-11-19 MED ORDER — SODIUM CHLORIDE 0.9% FLUSH: 3.0000 mL | INTRAVENOUS | Status: DC | PRN

## 2019-11-19 MED ORDER — SODIUM CHLORIDE 0.9 % WEIGHT BASED INFUSION
3.0000 mL/kg/h | INTRAVENOUS | Status: AC
  Administered 2019-11-19: 3 mL/kg/h via INTRAVENOUS

## 2019-11-19 MED ORDER — IOHEXOL 350 MG/ML SOLN
INTRAVENOUS | Status: DC | PRN
  Administered 2019-11-19: 95 mL

## 2019-11-19 MED ORDER — SODIUM CHLORIDE 0.9% FLUSH: 3.0000 mL | Freq: Two times a day (BID) | INTRAVENOUS | Status: DC

## 2019-11-19 MED ORDER — FENTANYL CITRATE (PF) 100 MCG/2ML IJ SOLN
INTRAMUSCULAR | Status: AC
  Filled 2019-11-19: qty 2

## 2019-11-19 MED ORDER — LIDOCAINE HCL (PF) 1 % IJ SOLN
INTRAMUSCULAR | Status: AC
  Filled 2019-11-19: qty 30

## 2019-11-19 MED ORDER — SODIUM CHLORIDE 0.9 % IV SOLN: 250.0000 mL | INTRAVENOUS | Status: DC | PRN

## 2019-11-19 MED ORDER — VERAPAMIL HCL 2.5 MG/ML IV SOLN
INTRAVENOUS | Status: DC | PRN
  Administered 2019-11-19 (×4): 200 ug via INTRACORONARY

## 2019-11-19 MED ORDER — ACETAMINOPHEN 325 MG PO TABS: 650.0000 mg | ORAL_TABLET | ORAL | Status: DC | PRN

## 2019-11-19 MED ORDER — BIVALIRUDIN BOLUS VIA INFUSION - CUPID
INTRAVENOUS | Status: DC | PRN
  Administered 2019-11-19: 52.05 mg via INTRAVENOUS

## 2019-11-19 MED ORDER — VERAPAMIL HCL 2.5 MG/ML IV SOLN
INTRAVENOUS | Status: AC
  Filled 2019-11-19: qty 2

## 2019-11-19 MED ORDER — SODIUM CHLORIDE 0.9 % IV SOLN
INTRAVENOUS | Status: DC | PRN
  Administered 2019-11-19: 1.75 mg/kg/h via INTRAVENOUS

## 2019-11-19 MED ORDER — LIDOCAINE-EPINEPHRINE 1 %-1:100000 IJ SOLN
INTRAMUSCULAR | Status: DC | PRN
  Administered 2019-11-19: 5 mL

## 2019-11-19 MED ORDER — LIDOCAINE HCL (PF) 1 % IJ SOLN
INTRAMUSCULAR | Status: DC | PRN
  Administered 2019-11-19: 15 mL

## 2019-11-19 MED ORDER — NITROGLYCERIN 1 MG/10 ML FOR IR/CATH LAB
INTRA_ARTERIAL | Status: AC
  Filled 2019-11-19: qty 10

## 2019-11-19 MED ORDER — BIVALIRUDIN TRIFLUOROACETATE 250 MG IV SOLR
INTRAVENOUS | Status: AC
  Filled 2019-11-19: qty 250

## 2019-11-19 MED ORDER — MIDAZOLAM HCL 2 MG/2ML IJ SOLN
INTRAMUSCULAR | Status: AC
  Filled 2019-11-19: qty 2

## 2019-11-19 SURGICAL SUPPLY — 22 items
BALLN SAPPHIRE 2.0X12 (BALLOONS) ×2
BALLN ~~LOC~~ EMERGE MR 3.25X20 (BALLOONS) ×2
BALLOON SAPPHIRE 2.0X12 (BALLOONS) IMPLANT
BALLOON ~~LOC~~ EMERGE MR 3.25X20 (BALLOONS) IMPLANT
CATH EXPO 5F MPA-1 (CATHETERS) ×1 IMPLANT
CATH INFINITI 5 FR IM (CATHETERS) ×1 IMPLANT
CATH INFINITI 5FR MULTPACK ANG (CATHETERS) ×1 IMPLANT
CATH VISTA GUIDE 6FR MPA1 (CATHETERS) ×1 IMPLANT
DEVICE CLOSURE PERCLS PRGLD 6F (VASCULAR PRODUCTS) IMPLANT
KIT ENCORE 26 ADVANTAGE (KITS) ×1 IMPLANT
KIT HEART LEFT (KITS) ×2 IMPLANT
PACK CARDIAC CATHETERIZATION (CUSTOM PROCEDURE TRAY) ×2 IMPLANT
PERCLOSE PROGLIDE 6F (VASCULAR PRODUCTS) ×2
SHEATH PINNACLE 5F 10CM (SHEATH) ×1 IMPLANT
SHEATH PINNACLE 6F 10CM (SHEATH) ×1 IMPLANT
SHEATH PROBE COVER 6X72 (BAG) ×1 IMPLANT
STENT SYNERGY XD 3.0X48 (Permanent Stent) IMPLANT
SYNERGY XD 3.0X48 (Permanent Stent) ×2 IMPLANT
TRANSDUCER W/STOPCOCK (MISCELLANEOUS) ×2 IMPLANT
TUBING CIL FLEX 10 FLL-RA (TUBING) ×2 IMPLANT
WIRE EMERALD 3MM-J .035X150CM (WIRE) ×1 IMPLANT
WIRE HI TORQ WHISPER MS 190CM (WIRE) ×1 IMPLANT

## 2019-11-19 NOTE — Progress Notes (Signed)
Dr Excell Seltzer in to see pt. States ok to discharge pt when ready

## 2019-11-19 NOTE — Discharge Instructions (Signed)
Femoral Site Care This sheet gives you information about how to care for yourself after your procedure. Your health care provider may also give you more specific instructions. If you have problems or questions, contact your health care provider. What can I expect after the procedure? After the procedure, it is common to have:  Bruising that usually fades within 1-2 weeks.  Tenderness at the site. Follow these instructions at home: Wound care  Follow instructions from your health care provider about how to take care of your insertion site. Make sure you: ? Wash your hands with soap and water before you change your bandage (dressing). If soap and water are not available, use hand sanitizer. ? Change your dressing as told by your health care provider. ? Leave stitches (sutures), skin glue, or adhesive strips in place. These skin closures may need to stay in place for 2 weeks or longer. If adhesive strip edges start to loosen and curl up, you may trim the loose edges. Do not remove adhesive strips completely unless your health care provider tells you to do that.  Do not take baths, swim, or use a hot tub until your health care provider approves.  You may shower 24-48 hours after the procedure or as told by your health care provider. ? Gently wash the site with plain soap and water. ? Pat the area dry with a clean towel. ? Do not rub the site. This may cause bleeding.  Do not apply powder or lotion to the site. Keep the site clean and dry.  Check your femoral site every day for signs of infection. Check for: ? Redness, swelling, or pain. ? Fluid or blood. ? Warmth. ? Pus or a bad smell. Activity  For the first 2-3 days after your procedure, or as long as directed: ? Avoid climbing stairs as much as possible. ? Do not squat.  Do not lift anything that is heavier than 10 lb (4.5 kg), or the limit that you are told, until your health care provider says that it is safe.  Rest as  directed. ? Avoid sitting for a long time without moving. Get up to take short walks every 1-2 hours.  Do not drive for 24 hours if you were given a medicine to help you relax (sedative). General instructions  Take over-the-counter and prescription medicines only as told by your health care provider.  Keep all follow-up visits as told by your health care provider. This is important. Contact a health care provider if you have:  A fever or chills.  You have redness, swelling, or pain around your insertion site. Get help right away if:  The catheter insertion area swells very fast.  You pass out.  You suddenly start to sweat or your skin gets clammy.  The catheter insertion area is bleeding, and the bleeding does not stop when you hold steady pressure on the area.  The area near or just beyond the catheter insertion site becomes pale, cool, tingly, or numb. These symptoms may represent a serious problem that is an emergency. Do not wait to see if the symptoms will go away. Get medical help right away. Call your local emergency services (911 in the [place] Do not drive yourself to the hospital. Summary  After the procedure, it is common to have bruising that usually fades within 1-2 weeks.  Check your femoral site every day for signs of infection.  Do not lift anything that is heavier than 10 lb (4.5 kg), or the   limit that you are told, until your health care provider says that it is safe. This information is not intended to replace advice given to you by your health care provider. Make sure you discuss any questions you have with your health care provider. Document Revised: 07/03/2017 Document Reviewed: 07/03/2017 Elsevier Patient Education  2020 [employer]  

## 2019-11-19 NOTE — Discharge Summary (Signed)
Discharge Summary for Same Day PCI   Patient ID: James Armstrong MRN: [id number]; DOB: [birth year]  Admit date: 11/19/2019 Discharge date: 11/19/2019  Primary Care Provider: Paulina Fusi, MD  Primary Cardiologist: Norman Herrlich, MD  Primary Electrophysiologist:  None   Discharge Diagnoses    Principal Problem:   Coronary artery disease involving native heart with unstable angina pectoris [facility]) Active Problems:   Abdominal aortic aneurysm [facility])   Essential hypertension   Hx of CABG   Hyperlipemia    Diagnostic Studies/Procedures    Cardiac Catheterization 11/19/2019:  A drug-eluting stent was successfully placed using a SYNERGY XD 3.0X48.  Post intervention, there is a 0% residual stenosis.  Post intervention, there is a 0% residual stenosis.   1.  Three-vessel coronary artery disease with total occlusion of the LAD and RCA, moderate stenosis of the stented segment in the distal left mainstem the left circumflex, unchanged from the previous cardiac catheterization study 2.  Status post CABG with continued patency of the LIMA to LAD and saphenous vein graft to obtuse marginal 3.  Interval subtotal occlusion of the saphenous vein graft to PDA with 99% stenosis and TIMI I flow, treated successfully with PCI using a 3.0 x 48 mm Synergy drug-eluting stent 4.  Normal LVEDP  Recommendations: Continue dual antiplatelet therapy with aspirin and clopidogrel indefinitely in the setting of degenerated saphenous vein graft disease and multiple PCI procedures.  Patient eligible for same-day PCI per protocol.  Close follow-up with Dr. Dulce Sellar. _____________   History of Present Illness     James Armstrong is a 79 y.o. male with a history of CAD s/p CABG in 2003 with PCI/DES to distal left main/ostial LCX in 08/2016, CVA, CEA, small abdominal aneurysm, hypertension, dyslipidemia, GERD. His last cardiac catheterization in 07/2017 showed 3-vessel CAD with all grafts being patent. Patient  was recently seen by Dr. Dulce Sellar on 11/06/2019 at which time he was not doing well and reported being unsatisfied with the quality of his life. He was having trouble with basic activities such as taking the laundry out of the dryer and reports having to stop and rest with this. He also reports exercise intolerance and shortness of breath. Labs were unremarkable. Symptoms persisted despite intensification of medical therapy. Therefore, decision was made to proceed with cardiac catheterization for further evaluation.  Hospital Course     The patient underwent cardiac cath as noted above and had successful PCI with DES to subtotal occlusion of the SVG to PDA. Plan is to continue long-term dual antiplatelet therapy with Aspirin and Plavix (beyond 12 months). The patient was seen by Cardiac Rehab while in short stay. There were no observed complications post cath. Right femoral cath site was re-evaluated prior to discharge and found to be stable without any complications. Instructions/precautions regarding cath site care were given prior to discharge.  [name] was seen by Dr. Excell Seltzer and determined stable for discharge home. Follow up with our office has been arranged. Medications are listed below. Amlodipine was discontinued due to GI upset and Imdur decreased to 30mg  daily due to dizziness.   _____________  Cath/PCI Registry Performance & Quality Measures: 1. Aspirin prescribed? - Yes 2. ADP Receptor Inhibitor (Plavix/Clopidogrel, Brilinta/Ticagrelor or Effient/Prasugrel) prescribed (includes medically managed patients)? - Yes 3. High Intensity Statin (Lipitor 40-80mg  or Crestor 20-40mg ) prescribed? - Yes 4. For EF <40%, was ACEI/ARB prescribed? - Not Applicable (EF >/= 40%) 5. For EF <40%, Aldosterone Antagonist (Spironolactone or Eplerenone) prescribed? - Not Applicable (  EF >/= 40%) 6. Cardiac Rehab Phase II ordered (Included Medically managed Patients)? - Yes  _____________   Discharge  Vitals Blood pressure (!) 135/54, pulse 71, temperature 97.6 F (36.4 C), temperature source Skin, resp. rate 19, height  (1.676 m), weight 69.4 kg, SpO2 97 %.  Filed Weights   11/19/19 0542  Weight: 69.4 kg    Last Labs & Radiologic Studies    CBC Recent Labs    11/18/19 1002  WBC 5.4  HGB 10.5*  HCT 32.1*  MCV 90  PLT 231   Basic Metabolic Panel Recent Labs    16/10/96 1002  NA 139  K 4.4  CL 104  CO2 27  GLUCOSE 117*  BUN 19  CREATININE 1.10  CALCIUM 9.5   Liver Function Tests No results for input(s): AST, ALT, ALKPHOS, BILITOT, PROT, ALBUMIN in the last 72 hours. No results for input(s): LIPASE, AMYLASE in the last 72 hours. High Sensitivity Troponin:   No results for input(s): TROPONINIHS in the last 720 hours.  BNP Invalid input(s): POCBNP D-Dimer No results for input(s): DDIMER in the last 72 hours. Hemoglobin A1C No results for input(s): HGBA1C in the last 72 hours. Fasting Lipid Panel No results for input(s): CHOL, HDL, LDLCALC, TRIG, CHOLHDL, LDLDIRECT in the last 72 hours. Thyroid Function Tests No results for input(s): TSH, T4TOTAL, T3FREE, THYROIDAB in the last 72 hours.  Invalid input(s): FREET3 _____________  CARDIAC CATHETERIZATION  Result Date: 11/19/2019  A drug-eluting stent was successfully placed using a SYNERGY XD 3.0X48.  Post intervention, there is a 0% residual stenosis.  Post intervention, there is a 0% residual stenosis.  1.  Three-vessel coronary artery disease with total occlusion of the LAD and RCA, moderate stenosis of the stented segment in the distal left mainstem the left circumflex, unchanged from the previous cardiac catheterization study 2.  Status post CABG with continued patency of the LIMA to LAD and saphenous vein graft to obtuse marginal 3.  Interval subtotal occlusion of the saphenous vein graft to PDA with 99% stenosis and TIMI I flow, treated successfully with PCI using a 3.0 x 48 mm Synergy drug-eluting stent  4.  Normal LVEDP Recommendations: Continue dual antiplatelet therapy with aspirin and clopidogrel indefinitely in the setting of degenerated saphenous vein graft disease and multiple PCI procedures.  Patient eligible for same-day PCI per protocol.  Close follow-up with Dr. Dulce Sellar.   ECHOCARDIOGRAM COMPLETE  Result Date: 11/08/2019    ECHOCARDIOGRAM REPORT   Patient Name:   KEISHON CHAVARIN Date of Exam: 11/08/2019 Medical Rec #:  [id number]        Height:       66.0 in Accession #:    [id number]       Weight:       154.1 lb Date of Birth:  01-Jul-1941        BSA:          1.790 m Patient Age:    78 years         BP:           122/78 mmHg Patient Gender: M                HR:           56 bpm. Exam Location:  [place] Procedure: 2D Echo, Cardiac Doppler, Color Doppler and Strain Analysis Indications:    Palpitations  History:        Patient has no prior history of Echocardiogram examinations.  Risk Factors:Hypertension.  Sonographer:    Cardell Peach RDCS (AE) Referring Phys: [id number] [place]  1. Left ventricular ejection fraction, by estimation, is 60 to 65%. The left ventricle has normal function. The left ventricle has no regional wall motion abnormalities.  2. Right ventricular systolic function is normal. The right ventricular size is normal. There is mildly elevated pulmonary artery systolic pressure.  3. The mitral valve is normal in structure. Mild mitral valve regurgitation. No evidence of mitral stenosis.  4. The aortic valve is normal in structure. Aortic valve regurgitation is mild. Mild aortic valve sclerosis is present, with no evidence of aortic valve stenosis. FINDINGS  Left Ventricle: Left ventricular ejection fraction, by estimation, is 60 to 65%. The left ventricle has normal function. The left ventricle has no regional wall motion abnormalities. The left ventricular internal cavity size was normal in size. There is  no left ventricular hypertrophy. Left ventricular  diastolic parameters were normal. Right Ventricle: The right ventricular size is normal. No increase in right ventricular wall thickness. Right ventricular systolic function is normal. There is mildly elevated pulmonary artery systolic pressure. The tricuspid regurgitant velocity is 3.07  m/s, and with an assumed right atrial pressure of 3 mmHg, the estimated right ventricular systolic pressure is 06.2 mmHg. Left Atrium: Left atrial size was normal in size. Right Atrium: Right atrial size was normal in size. Pericardium: There is no evidence of pericardial effusion. Mitral Valve: The mitral valve is normal in structure. Normal mobility of the mitral valve leaflets. Mild mitral valve regurgitation. No evidence of mitral valve stenosis. Tricuspid Valve: The tricuspid valve is normal in structure. Tricuspid valve regurgitation is not demonstrated. No evidence of tricuspid stenosis. Aortic Valve: The aortic valve is normal in structure. Aortic valve regurgitation is mild. Mild aortic valve sclerosis is present, with no evidence of aortic valve stenosis. Aortic valve mean gradient measures 8.5 mmHg. Aortic valve peak gradient measures 15.3 mmHg. Aortic valve area, by VTI measures 1.94 cm. Pulmonic Valve: The pulmonic valve was normal in structure. Pulmonic valve regurgitation is not visualized. No evidence of pulmonic stenosis. Aorta: The aortic root is normal in size and structure. Venous: The inferior vena cava is normal in size with greater than 50% respiratory variability, suggesting right atrial pressure of 3 mmHg. IAS/Shunts: No atrial level shunt detected by color flow Doppler.  LEFT VENTRICLE PLAX 2D LVIDd:         4.61 cm  Diastology LVIDs:         2.89 cm  LV e' lateral:   11.20 cm/s LV PW:         1.02 cm  LV E/e' lateral: 7.2 LV IVS:        1.18 cm  LV e' medial:    5.66 cm/s LVOT diam:     2.10 cm  LV E/e' medial:  14.2 LV SV:         89 LV SV Index:   50 LVOT Area:     3.46 cm  RIGHT VENTRICLE             IVC RV Basal diam:  3.90 cm    IVC diam: 1.43 cm RV S prime:     6.42 cm/s TAPSE (M-mode): 1.9 cm LEFT ATRIUM             Index       RIGHT ATRIUM           Index LA diam:  3.80 cm 2.12 cm/m  RA Area:     17.50 cm LA Vol (A2C):   51.8 ml 28.94 ml/m RA Volume:   46.10 ml  25.75 ml/m LA Vol (A4C):   59.9 ml 33.46 ml/m LA Biplane Vol: 59.6 ml 33.30 ml/m  AORTIC VALVE AV Area (Vmax):    1.89 cm AV Area (Vmean):   1.92 cm AV Area (VTI):     1.94 cm AV Vmax:           195.67 cm/s AV Vmean:          136.500 cm/s AV VTI:            0.458 m AV Peak Grad:      15.3 mmHg AV Mean Grad:      8.5 mmHg LVOT Vmax:         107.00 cm/s LVOT Vmean:        75.500 cm/s LVOT VTI:          0.257 m LVOT/AV VTI ratio: 0.56  AORTA Ao Root diam: 3.00 cm Ao Asc diam:  3.60 cm MITRAL VALVE               TRICUSPID VALVE MV Area (PHT): 2.42 cm    TR Peak grad:   37.7 mmHg MV Decel Time: 314 msec    TR Vmax:        307.00 cm/s MV E velocity: 80.50 cm/s MV A velocity: 85.40 cm/s  SHUNTS MV E/A ratio:  0.94        Systemic VTI:  0.26 m                            Systemic Diam: 2.10 cm Belva Crome MD Electronically signed by Belva Crome MD Signature Date/Time: 11/08/2019/1:52:51 PM    Final     Disposition   Patient is being discharged home today in good condition.  Follow-up Plans & Appointments    Follow-up Information    Baldo Daub, MD Follow up.   Specialty: Cardiology Why: Follow-up scheduled for 12/23/2019 at 11:00am. Please arrive 15 minutes early for check-in.  Contact information: [address] [place] [address] [phone]          Discharge Instructions    Amb Referral to Cardiac Rehabilitation   Complete by: As directed    To [place]   Diagnosis:  PTCA Coronary Stents     After initial evaluation and assessments completed: Virtual Based Care may be provided alone or in conjunction with Phase 2 Cardiac Rehab based on patient barriers.: Yes       Discharge  Medications   Allergies as of 11/19/2019      Reactions   Ranexa [ranolazine] Nausea Only      Medication List    STOP taking these medications   amLODipine 5 MG tablet Commonly known as: NORVASC     TAKE these medications   aspirin EC 81 MG tablet Take 81 mg by mouth daily.   atorvastatin 80 MG tablet Commonly known as: LIPITOR Take 80 mg by mouth every evening.   cholecalciferol 25 MCG (1000 UNIT) tablet Commonly known as: VITAMIN D Take 1,000 Units by mouth daily.   clopidogrel 75 MG tablet Commonly known as: PLAVIX Take 75 mg by mouth daily.   ELDERBERRY PO Take 1 tablet by mouth daily. Gummie   escitalopram 5 MG tablet Commonly known as: LEXAPRO Take 5 mg by  mouth every evening.   ezetimibe 10 MG tablet Commonly known as: ZETIA Take 10 mg by mouth daily.   fenofibrate 160 MG tablet Take 160 mg by mouth daily.   Fish Oil 1000 MG Caps Take 1,000 mg by mouth 2 (two) times daily.   ibuprofen 200 MG tablet Commonly known as: ADVIL Take 400 mg by mouth every 6 (six) hours as needed for headache or moderate pain.   isosorbide mononitrate 30 MG 24 hr tablet Commonly known as: IMDUR Take 30 mg by mouth daily. What changed: Another medication with the same name was removed. Continue taking this medication, and follow the directions you see here.   metoprolol tartrate 50 MG tablet Commonly known as: LOPRESSOR Take 0.5 tablets (25 mg total) by mouth daily. What changed: when to take this   multivitamin with minerals Tabs tablet Take 1 tablet by mouth daily.   nitroGLYCERIN 0.4 MG SL tablet Commonly known as: NITROSTAT Place 1 tablet (0.4 mg total) under the tongue every 5 (five) minutes as needed for chest pain. What changed: when to take this   omeprazole 20 MG capsule Commonly known as: PRILOSEC Take 40 mg by mouth at bedtime.   vitamin C 1000 MG tablet Take 1,000 mg by mouth daily.   VITAMIN C GUMMIE PO Take 2 tablets by mouth daily.           Allergies Allergies  Allergen Reactions  . Ranexa [Ranolazine] Nausea Only    Outstanding Labs/Studies   N/A  Duration of Discharge Encounter   Greater than 30 minutes including physician time.  Signed, Corrin Parker, PA-C 11/19/2019, 2:36 PM

## 2019-11-19 NOTE — Progress Notes (Signed)
Discussed stent, Plavix, restrictions, diet, exercise, NTG and CRPII. Pt receptive. Eager to start walking again. Will refer to [place] CRPII. Pt understands the importance of Plavix. [id number] Ethelda Chick CES, ACSM 10:57 AM 11/19/2019

## 2019-11-19 NOTE — Progress Notes (Signed)
Discharge instructions reviewed with pt  And his wife (via telephone) both voice understanding.  

## 2019-11-19 NOTE — Interval H&P Note (Signed)
Cath Lab Visit (complete for each Cath Lab visit)  Clinical Evaluation Leading to the Procedure:   ACS: No.  Non-ACS:    Anginal Classification: CCS III  Anti-ischemic medical therapy: Maximal Therapy (2 or more classes of medications)  Non-Invasive Test Results: No non-invasive testing performed  Prior CABG: Previous CABG      History and Physical Interval Note:  11/19/2019 8:02 AM  James Armstrong  has presented today for surgery, with the diagnosis of CAD.  The various methods of treatment have been discussed with the patient and family. After consideration of risks, benefits and other options for treatment, the patient has consented to  Procedure(s): LEFT HEART CATH AND CORS/GRAFTS ANGIOGRAPHY (N/A) as a surgical intervention.  The patient's history has been reviewed, patient examined, no change in status, stable for surgery.  I have reviewed the patient's chart and labs.  Questions were answered to the patient's satisfaction.     Tonny Bollman

## 2019-11-21 ENCOUNTER — Telehealth (HOSPITAL_COMMUNITY): Payer: Self-pay

## 2019-11-21 NOTE — Telephone Encounter (Signed)
Faxed referral to [facility] for Cardiac Rehab. 

## 2019-12-03 DIAGNOSIS — Z7982 Long term (current) use of aspirin: Secondary | ICD-10-CM | POA: Diagnosis not present

## 2019-12-03 DIAGNOSIS — Z955 Presence of coronary angioplasty implant and graft: Secondary | ICD-10-CM | POA: Diagnosis not present

## 2019-12-03 DIAGNOSIS — I2510 Atherosclerotic heart disease of native coronary artery without angina pectoris: Secondary | ICD-10-CM | POA: Diagnosis not present

## 2019-12-04 DIAGNOSIS — Z955 Presence of coronary angioplasty implant and graft: Secondary | ICD-10-CM | POA: Diagnosis not present

## 2019-12-04 DIAGNOSIS — I2510 Atherosclerotic heart disease of native coronary artery without angina pectoris: Secondary | ICD-10-CM | POA: Diagnosis not present

## 2019-12-04 DIAGNOSIS — Z7982 Long term (current) use of aspirin: Secondary | ICD-10-CM | POA: Diagnosis not present

## 2019-12-06 DIAGNOSIS — I2510 Atherosclerotic heart disease of native coronary artery without angina pectoris: Secondary | ICD-10-CM | POA: Diagnosis not present

## 2019-12-06 DIAGNOSIS — Z955 Presence of coronary angioplasty implant and graft: Secondary | ICD-10-CM | POA: Diagnosis not present

## 2019-12-06 DIAGNOSIS — Z7982 Long term (current) use of aspirin: Secondary | ICD-10-CM | POA: Diagnosis not present

## 2019-12-09 DIAGNOSIS — Z955 Presence of coronary angioplasty implant and graft: Secondary | ICD-10-CM | POA: Diagnosis not present

## 2019-12-09 DIAGNOSIS — I2510 Atherosclerotic heart disease of native coronary artery without angina pectoris: Secondary | ICD-10-CM | POA: Diagnosis not present

## 2019-12-09 DIAGNOSIS — Z7982 Long term (current) use of aspirin: Secondary | ICD-10-CM | POA: Diagnosis not present

## 2019-12-11 DIAGNOSIS — I2510 Atherosclerotic heart disease of native coronary artery without angina pectoris: Secondary | ICD-10-CM | POA: Diagnosis not present

## 2019-12-11 DIAGNOSIS — Z955 Presence of coronary angioplasty implant and graft: Secondary | ICD-10-CM | POA: Diagnosis not present

## 2019-12-11 DIAGNOSIS — Z7982 Long term (current) use of aspirin: Secondary | ICD-10-CM | POA: Diagnosis not present

## 2019-12-12 DIAGNOSIS — I10 Essential (primary) hypertension: Secondary | ICD-10-CM | POA: Diagnosis not present

## 2019-12-12 DIAGNOSIS — H6121 Impacted cerumen, right ear: Secondary | ICD-10-CM | POA: Diagnosis not present

## 2019-12-12 DIAGNOSIS — I2510 Atherosclerotic heart disease of native coronary artery without angina pectoris: Secondary | ICD-10-CM | POA: Diagnosis not present

## 2019-12-12 DIAGNOSIS — E785 Hyperlipidemia, unspecified: Secondary | ICD-10-CM | POA: Diagnosis not present

## 2019-12-13 DIAGNOSIS — Z7982 Long term (current) use of aspirin: Secondary | ICD-10-CM | POA: Diagnosis not present

## 2019-12-13 DIAGNOSIS — I2510 Atherosclerotic heart disease of native coronary artery without angina pectoris: Secondary | ICD-10-CM | POA: Diagnosis not present

## 2019-12-13 DIAGNOSIS — Z955 Presence of coronary angioplasty implant and graft: Secondary | ICD-10-CM | POA: Diagnosis not present

## 2019-12-16 DIAGNOSIS — Z955 Presence of coronary angioplasty implant and graft: Secondary | ICD-10-CM | POA: Diagnosis not present

## 2019-12-16 DIAGNOSIS — I2510 Atherosclerotic heart disease of native coronary artery without angina pectoris: Secondary | ICD-10-CM | POA: Diagnosis not present

## 2019-12-16 DIAGNOSIS — Z7982 Long term (current) use of aspirin: Secondary | ICD-10-CM | POA: Diagnosis not present

## 2019-12-18 DIAGNOSIS — Z7982 Long term (current) use of aspirin: Secondary | ICD-10-CM | POA: Diagnosis not present

## 2019-12-18 DIAGNOSIS — Z955 Presence of coronary angioplasty implant and graft: Secondary | ICD-10-CM | POA: Diagnosis not present

## 2019-12-18 DIAGNOSIS — I2510 Atherosclerotic heart disease of native coronary artery without angina pectoris: Secondary | ICD-10-CM | POA: Diagnosis not present

## 2019-12-20 DIAGNOSIS — Z7982 Long term (current) use of aspirin: Secondary | ICD-10-CM | POA: Diagnosis not present

## 2019-12-20 DIAGNOSIS — I2510 Atherosclerotic heart disease of native coronary artery without angina pectoris: Secondary | ICD-10-CM | POA: Diagnosis not present

## 2019-12-20 DIAGNOSIS — Z955 Presence of coronary angioplasty implant and graft: Secondary | ICD-10-CM | POA: Diagnosis not present

## 2019-12-22 NOTE — Progress Notes (Signed)
Cardiology Office Note:    Date:  12/23/2019   ID:  James Armstrong, DOB October 26, 1940, MRN [id number]  PCP:  Paulina Fusi, MD  Cardiologist:  Norman Herrlich, MD    Referring MD: Paulina Fusi, MD    ASSESSMENT:    1. Coronary artery disease involving native coronary artery of native heart with unstable angina pectoris (HCC)   2. Essential hypertension   3. Pure hypercholesterolemia    PLAN:    In order of problems listed above:  1. [name] is markedly improved after PCI and stent saphenous vein graft posterior descending artery present having no angina New York Heart Association class I therapy including dual antiplatelet beta-blocker and lipid-lowering with high intensity statin.  He will complete cardiac rehabilitation 2. BP at target continue current therapy beta-blocker 3. Lipids at target continue his statin   Next appointment: 6 months   Medication Adjustments/Labs and Tests Ordered: Current medicines are reviewed at length with the patient today.  Concerns regarding medicines are outlined above.  No orders of the defined types were placed in this encounter.  No orders of the defined types were placed in this encounter.   Chief Complaint  Patient presents with  . Coronary Artery Disease    After recent PCI saphenous vein graft to PDA    History of Present Illness:    James Armstrong is a 79 y.o. male with a hx of  CAD with PCI / Resolute Drug Eluting Stent of the distal Left Main/Ostial LCF 08/09/16 for troponin normal unstable angina, Dyslipidemia, HTN, S/P CABG in 2003, CVA, with CEA and a small abdominal aneurysm  last seen 11/06/2019.He had PCI 11/19/2019 of subtotal occlusion of SVG to PDA. Compliance with diet, lifestyle and medications: Yes  Coronary Diagrams  Diagnostic Dominance: Right  Intervention     I am pleased to say he is a new man since his return home his exercise tolerance and exertional angina shortness of breath is resolved he  feels back to normal and is successfully engaged in cardiac rehabilitation.  No angina shortness of breath chest pain palpitation or syncope he tolerates his dual antiplatelet therapy without bleeding and his lipid-lowering without muscle pain or weakness Past Medical History:  Diagnosis Date  . Abdominal aortic aneurysm (AAA) 30 to 34 mm in diameter (HCC)   . Abdominal aortic aneurysm (HCC) 02/24/2016  . Asthma   . Carotid stenosis, bilateral 09/25/2018  . Chest pain 02/22/2017  . Coronary artery disease involving native coronary artery of native heart with angina pectoris (HCC) 02/24/2016   CABG in 2004  coronary angiography done 08/09/16 Abnormal Diagnostic Summary Chronic Total Occlusion of the RCA Severe stenosis of the LM, LAD, Circumflex Patent LIMA graft to the mid LAD. Patent SVG graft to the Ramus coronary artery. Moderate disease of SVG graft to the Posterior descending coronary artery. LV not done due to tortuosity of right subclavian Interventional Summary Successful PCI / Resolute Drug Eluting Stent of the distal Left Main/Ostial Circumflex Coronary Artery.      . Coronary artery disease involving native heart with unstable angina pectoris (HCC) 02/24/2016   CABG in 2004  coronary angiography done 08/09/16 Abnormal Diagnostic Summary Chronic Total Occlusion of the RCA Severe stenosis of the LM, LAD, Circumflex Patent LIMA graft to the mid LAD. Patent SVG graft to the Ramus coronary artery. Moderate disease of SVG graft to the Posterior descending coronary artery. LV not done due to tortuosity of right subclavian Interventional Summary Successful PCI /  .  Crohn disease (HCC)   . Essential hypertension 02/24/2016  . GERD (gastroesophageal reflux disease)   . Hx of CABG 02/24/2016   Overview:  In 2003  . Hyperlipemia 02/24/2016  . Mixed dyslipidemia 07/15/2017  . Multiple vessel coronary artery disease 07/15/2017  . Myopathy 02/01/2018  . Presence of stent in coronary artery in patient with  coronary artery disease 07/15/2017  . Shortness of breath 08/06/2016  . Sinus bradycardia 08/06/2016  . Stroke [facility])     Past Surgical History:  Procedure Laterality Date  . CARDIAC CATHETERIZATION    . CAROTID ENDARTERECTOMY    . CHOLECYSTECTOMY    . CORONARY ARTERY BYPASS GRAFT     2004  . CORONARY STENT INTERVENTION N/A 11/19/2019   Procedure: CORONARY STENT INTERVENTION;  Surgeon: Tonny Bollman, MD;  Location: [facility] INVASIVE CV LAB;  Service: Cardiovascular;  Laterality: N/A;  . HERNIA REPAIR    . LEFT HEART CATH AND CORS/GRAFTS ANGIOGRAPHY N/A 07/26/2017   Procedure: LEFT HEART CATH AND CORS/GRAFTS ANGIOGRAPHY;  Surgeon: Kathleene Hazel, MD;  Location: [facility] INVASIVE CV LAB;  Service: Cardiovascular;  Laterality: N/A;  . LEFT HEART CATH AND CORS/GRAFTS ANGIOGRAPHY N/A 11/19/2019   Procedure: LEFT HEART CATH AND CORS/GRAFTS ANGIOGRAPHY;  Surgeon: Tonny Bollman, MD;  Location: [facility] INVASIVE CV LAB;  Service: Cardiovascular;  Laterality: N/A;    Current Medications: Current Meds  Medication Sig  . Ascorbic Acid (VITAMIN C) 1000 MG tablet Take 1,000 mg by mouth daily.   Marland Kitchen aspirin EC 81 MG tablet Take 81 mg by mouth daily.  Marland Kitchen atorvastatin (LIPITOR) 80 MG tablet Take 80 mg by mouth every evening.   . cholecalciferol (VITAMIN D) 25 MCG (1000 UNIT) tablet Take 1,000 Units by mouth daily.  . clopidogrel (PLAVIX) 75 MG tablet Take 75 mg by mouth daily.  Marland Kitchen ELDERBERRY PO Take 1 tablet by mouth daily. Gummie  . escitalopram (LEXAPRO) 5 MG tablet Take 5 mg by mouth every evening.   . ezetimibe (ZETIA) 10 MG tablet Take 10 mg by mouth daily.  . fenofibrate 160 MG tablet Take 160 mg by mouth daily.  Marland Kitchen ibuprofen (ADVIL,MOTRIN) 200 MG tablet Take 400 mg by mouth every 6 (six) hours as needed for headache or moderate pain.  . isosorbide mononitrate (IMDUR) 30 MG 24 hr tablet Take 30 mg by mouth daily.  . metoprolol tartrate (LOPRESSOR) 50 MG tablet Take 0.5 tablets (25 mg total) by mouth daily.  (Patient taking differently: Take 25 mg by mouth at bedtime. )  . Multiple Vitamin (MULTIVITAMIN WITH MINERALS) TABS tablet Take 1 tablet by mouth daily.  . nitroGLYCERIN (NITROSTAT) 0.4 MG SL tablet Place 1 tablet (0.4 mg total) under the tongue every 5 (five) minutes as needed for chest pain. (Patient taking differently: Place 0.4 mg under the tongue every 5 (five) minutes x 3 doses as needed for chest pain. )  . Omega-3 Fatty Acids (FISH OIL) 1000 MG CAPS Take 1,000 mg by mouth 2 (two) times daily.   Marland Kitchen omeprazole (PRILOSEC) 20 MG capsule Take 40 mg by mouth at bedtime.     Allergies:   Ranexa [ranolazine]   Social History   Socioeconomic History  . Marital status: Single    Spouse name: Not on file  . Number of children: Not on file  . Years of education: Not on file  . Highest education level: Not on file  Occupational History  . Not on file  Tobacco Use  . Smoking status: Never Smoker  .  Smokeless tobacco: Never Used  Vaping Use  . Vaping Use: Never used  Substance and Sexual Activity  . Alcohol use: No  . Drug use: No  . Sexual activity: Not on file  Other Topics Concern  . Not on file  Social History Narrative  . Not on file   Social Determinants of Health   Financial Resource Strain:   . Difficulty of Paying Living Expenses:   Food Insecurity:   . Worried About Charity fundraiser in the Last Year:   . Arboriculturist in the Last Year:   Transportation Needs:   . Film/video editor (Medical):   Marland Kitchen Lack of Transportation (Non-Medical):   Physical Activity:   . Days of Exercise per Week:   . Minutes of Exercise per Session:   Stress:   . Feeling of Stress :   Social Connections:   . Frequency of Communication with Friends and Family:   . Frequency of Social Gatherings with Friends and Family:   . Attends Religious Services:   . Active Member of Clubs or Organizations:   . Attends Archivist Meetings:   Marland Kitchen Marital Status:      Family  History: The patient's family history includes Diabetes in his brother; Heart attack in his brother; Stroke in his mother. ROS:   Please see the history of present illness.    All other systems reviewed and are negative.  EKGs/Labs/Other Studies Reviewed:    The following studies were reviewed today:  EKG: Performed [facility] 11/19/2019 immediately post PCI sinus rhythm first-degree AV block otherwise normal  Recent Labs: 09/22/2019: ALT 28 11/18/2019: BUN 19; Creatinine, Ser 1.10; Hemoglobin 10.5; Platelets 231; Potassium 4.4; Sodium 139  Recent Lipid Panel    Component Value Date/Time   CHOL 173 06/19/2018 0805   TRIG 102 06/19/2018 0805   HDL 41 06/19/2018 0805   CHOLHDL 4.2 06/19/2018 0805   LDLCALC 112 (H) 06/19/2018 0805   Recent labs 08/27/2019 cholesterol 146 HDL 38 LDL 84 at target triglycerides 137 creatinine 1.10 Physical Exam:    VS:  BP 136/60   Pulse 62   Ht 5\' 6"  (1.676 m)   Wt 157 lb (71.2 kg)   SpO2 96%   BMI 25.34 kg/m     Wt Readings from Last 3 Encounters:  12/23/19 157 lb (71.2 kg)  11/19/19 153 lb (69.4 kg)  11/06/19 154 lb 1.3 oz (69.9 kg)     GEN:  Well nourished, well developed in no acute distress HEENT: Normal NECK: No JVD; No carotid bruits LYMPHATICS: No lymphadenopathy CARDIAC: RRR, no murmurs, rubs, gallops RESPIRATORY:  Clear to auscultation without rales, wheezing or rhonchi  ABDOMEN: Soft, non-tender, non-distended MUSCULOSKELETAL:  No edema; No deformity  SKIN: Warm and dry NEUROLOGIC:  Alert and oriented x 3 PSYCHIATRIC:  Normal affect    Signed, Shirlee More, MD  12/23/2019 11:29 AM    [facility]

## 2019-12-23 ENCOUNTER — Ambulatory Visit: Payer: Medicare HMO | Admitting: Cardiology

## 2019-12-23 ENCOUNTER — Other Ambulatory Visit: Payer: Self-pay

## 2019-12-23 ENCOUNTER — Encounter: Payer: Self-pay | Admitting: Cardiology

## 2019-12-23 VITALS — BP 136/60 | HR 62 | Ht 66.0 in | Wt 157.0 lb

## 2019-12-23 DIAGNOSIS — I25110 Atherosclerotic heart disease of native coronary artery with unstable angina pectoris: Secondary | ICD-10-CM

## 2019-12-23 DIAGNOSIS — Z7982 Long term (current) use of aspirin: Secondary | ICD-10-CM | POA: Diagnosis not present

## 2019-12-23 DIAGNOSIS — I10 Essential (primary) hypertension: Secondary | ICD-10-CM

## 2019-12-23 DIAGNOSIS — E7800 Pure hypercholesterolemia, unspecified: Secondary | ICD-10-CM | POA: Diagnosis not present

## 2019-12-23 DIAGNOSIS — Z955 Presence of coronary angioplasty implant and graft: Secondary | ICD-10-CM | POA: Diagnosis not present

## 2019-12-23 DIAGNOSIS — I2510 Atherosclerotic heart disease of native coronary artery without angina pectoris: Secondary | ICD-10-CM | POA: Diagnosis not present

## 2019-12-23 NOTE — Patient Instructions (Signed)

## 2019-12-25 DIAGNOSIS — Z7982 Long term (current) use of aspirin: Secondary | ICD-10-CM | POA: Diagnosis not present

## 2019-12-25 DIAGNOSIS — I2510 Atherosclerotic heart disease of native coronary artery without angina pectoris: Secondary | ICD-10-CM | POA: Diagnosis not present

## 2019-12-25 DIAGNOSIS — Z955 Presence of coronary angioplasty implant and graft: Secondary | ICD-10-CM | POA: Diagnosis not present

## 2019-12-27 DIAGNOSIS — Z7982 Long term (current) use of aspirin: Secondary | ICD-10-CM | POA: Diagnosis not present

## 2019-12-27 DIAGNOSIS — I2510 Atherosclerotic heart disease of native coronary artery without angina pectoris: Secondary | ICD-10-CM | POA: Diagnosis not present

## 2019-12-27 DIAGNOSIS — Z955 Presence of coronary angioplasty implant and graft: Secondary | ICD-10-CM | POA: Diagnosis not present

## 2019-12-30 DIAGNOSIS — Z955 Presence of coronary angioplasty implant and graft: Secondary | ICD-10-CM | POA: Diagnosis not present

## 2019-12-30 DIAGNOSIS — I2510 Atherosclerotic heart disease of native coronary artery without angina pectoris: Secondary | ICD-10-CM | POA: Diagnosis not present

## 2019-12-30 DIAGNOSIS — Z7982 Long term (current) use of aspirin: Secondary | ICD-10-CM | POA: Diagnosis not present

## 2020-01-01 DIAGNOSIS — Z7982 Long term (current) use of aspirin: Secondary | ICD-10-CM | POA: Diagnosis not present

## 2020-01-01 DIAGNOSIS — Z955 Presence of coronary angioplasty implant and graft: Secondary | ICD-10-CM | POA: Diagnosis not present

## 2020-01-01 DIAGNOSIS — I2510 Atherosclerotic heart disease of native coronary artery without angina pectoris: Secondary | ICD-10-CM | POA: Diagnosis not present

## 2020-01-03 DIAGNOSIS — Z955 Presence of coronary angioplasty implant and graft: Secondary | ICD-10-CM | POA: Diagnosis not present

## 2020-01-07 DIAGNOSIS — Z955 Presence of coronary angioplasty implant and graft: Secondary | ICD-10-CM | POA: Diagnosis not present

## 2020-01-08 DIAGNOSIS — Z955 Presence of coronary angioplasty implant and graft: Secondary | ICD-10-CM | POA: Diagnosis not present

## 2020-01-10 DIAGNOSIS — Z955 Presence of coronary angioplasty implant and graft: Secondary | ICD-10-CM | POA: Diagnosis not present

## 2020-01-13 DIAGNOSIS — Z955 Presence of coronary angioplasty implant and graft: Secondary | ICD-10-CM | POA: Diagnosis not present

## 2020-01-15 DIAGNOSIS — Z955 Presence of coronary angioplasty implant and graft: Secondary | ICD-10-CM | POA: Diagnosis not present

## 2020-01-17 DIAGNOSIS — Z955 Presence of coronary angioplasty implant and graft: Secondary | ICD-10-CM | POA: Diagnosis not present

## 2020-01-20 DIAGNOSIS — Z955 Presence of coronary angioplasty implant and graft: Secondary | ICD-10-CM | POA: Diagnosis not present

## 2020-01-22 DIAGNOSIS — Z955 Presence of coronary angioplasty implant and graft: Secondary | ICD-10-CM | POA: Diagnosis not present

## 2020-01-24 DIAGNOSIS — Z955 Presence of coronary angioplasty implant and graft: Secondary | ICD-10-CM | POA: Diagnosis not present

## 2020-01-27 DIAGNOSIS — Z955 Presence of coronary angioplasty implant and graft: Secondary | ICD-10-CM | POA: Diagnosis not present

## 2020-01-29 DIAGNOSIS — Z955 Presence of coronary angioplasty implant and graft: Secondary | ICD-10-CM | POA: Diagnosis not present

## 2020-01-31 DIAGNOSIS — Z955 Presence of coronary angioplasty implant and graft: Secondary | ICD-10-CM | POA: Diagnosis not present

## 2020-02-05 DIAGNOSIS — Z955 Presence of coronary angioplasty implant and graft: Secondary | ICD-10-CM | POA: Diagnosis not present

## 2020-02-07 DIAGNOSIS — Z955 Presence of coronary angioplasty implant and graft: Secondary | ICD-10-CM | POA: Diagnosis not present

## 2020-02-10 DIAGNOSIS — Z955 Presence of coronary angioplasty implant and graft: Secondary | ICD-10-CM | POA: Diagnosis not present

## 2020-02-12 DIAGNOSIS — Z955 Presence of coronary angioplasty implant and graft: Secondary | ICD-10-CM | POA: Diagnosis not present

## 2020-02-14 DIAGNOSIS — Z955 Presence of coronary angioplasty implant and graft: Secondary | ICD-10-CM | POA: Diagnosis not present

## 2020-02-17 DIAGNOSIS — Z955 Presence of coronary angioplasty implant and graft: Secondary | ICD-10-CM | POA: Diagnosis not present

## 2020-02-19 DIAGNOSIS — Z955 Presence of coronary angioplasty implant and graft: Secondary | ICD-10-CM | POA: Diagnosis not present

## 2020-02-21 DIAGNOSIS — Z955 Presence of coronary angioplasty implant and graft: Secondary | ICD-10-CM | POA: Diagnosis not present

## 2020-02-24 DIAGNOSIS — E785 Hyperlipidemia, unspecified: Secondary | ICD-10-CM | POA: Diagnosis not present

## 2020-02-24 DIAGNOSIS — I714 Abdominal aortic aneurysm, without rupture: Secondary | ICD-10-CM | POA: Diagnosis not present

## 2020-02-24 DIAGNOSIS — I10 Essential (primary) hypertension: Secondary | ICD-10-CM | POA: Diagnosis not present

## 2020-02-24 DIAGNOSIS — Z6825 Body mass index (BMI) 25.0-25.9, adult: Secondary | ICD-10-CM | POA: Diagnosis not present

## 2020-02-24 DIAGNOSIS — Z79899 Other long term (current) drug therapy: Secondary | ICD-10-CM | POA: Diagnosis not present

## 2020-02-24 DIAGNOSIS — I2510 Atherosclerotic heart disease of native coronary artery without angina pectoris: Secondary | ICD-10-CM | POA: Diagnosis not present

## 2020-03-23 ENCOUNTER — Other Ambulatory Visit: Payer: Self-pay | Admitting: Cardiology

## 2020-03-23 MED ORDER — ISOSORBIDE MONONITRATE ER 30 MG PO TB24: 30.0000 mg | ORAL_TABLET | Freq: Every day | ORAL | 2 refills | Status: DC

## 2020-03-23 NOTE — Telephone Encounter (Signed)
*  STAT* If patient is at the pharmacy, call can be transferred to refill team.   1. Which medications need to be refilled? (please list name of each medication and dose if known)  isosorbide mononitrate (IMDUR) 30 MG 24 hr tablet  2. Which pharmacy/location (including street and city if local pharmacy) is medication to be sent to? [facility] - [place], [place] - [address]  3. Do they need a 30 day or 90 day supply? 90 with refills

## 2020-06-22 DIAGNOSIS — J45909 Unspecified asthma, uncomplicated: Secondary | ICD-10-CM | POA: Insufficient documentation

## 2020-06-22 DIAGNOSIS — K5090 Crohn's disease, unspecified, without complications: Secondary | ICD-10-CM | POA: Insufficient documentation

## 2020-06-22 DIAGNOSIS — I639 Cerebral infarction, unspecified: Secondary | ICD-10-CM | POA: Insufficient documentation

## 2020-06-22 DIAGNOSIS — K219 Gastro-esophageal reflux disease without esophagitis: Secondary | ICD-10-CM | POA: Insufficient documentation

## 2020-06-22 DIAGNOSIS — I714 Abdominal aortic aneurysm, without rupture, unspecified: Secondary | ICD-10-CM | POA: Insufficient documentation

## 2020-07-06 ENCOUNTER — Ambulatory Visit: Payer: Medicare HMO | Admitting: Cardiology

## 2020-07-07 DIAGNOSIS — Z0000 Encounter for general adult medical examination without abnormal findings: Secondary | ICD-10-CM | POA: Diagnosis not present

## 2020-07-07 DIAGNOSIS — Z9181 History of falling: Secondary | ICD-10-CM | POA: Diagnosis not present

## 2020-07-07 DIAGNOSIS — Z139 Encounter for screening, unspecified: Secondary | ICD-10-CM | POA: Diagnosis not present

## 2020-07-07 DIAGNOSIS — Z1331 Encounter for screening for depression: Secondary | ICD-10-CM | POA: Diagnosis not present

## 2020-07-14 NOTE — Progress Notes (Signed)
Cardiology Office Note:    Date:  07/15/2020   ID:  James Armstrong, DOB 1940/12/15, MRN [id number]  PCP:  Paulina Fusi, MD  Cardiologist:  Norman Herrlich, MD    Referring MD: Paulina Fusi, MD    ASSESSMENT:    1. Coronary artery disease involving native coronary artery of native heart with unstable angina pectoris (HCC)   2. Essential hypertension   3. Pure hypercholesterolemia   4. Abdominal aortic aneurysm (AAA) without rupture (HCC)    PLAN:    In order of problems listed above:  1. James Armstrong is doing well with CAD continue current treatment dual platelet along with beta-blocker and lipid-lowering treatment he is due for labs in the next month PCP office for lipid profile 2. BP at target continue current therapy beta-blocker 3. Lipids have been at target his most recent lipid profile 0H 23 2021 shows a cholesterol 161 LDL 89 triglycerides 213 HDL 36 4. He is due for repeat duplex scheduled   Next appointment: 6 months   Medication Adjustments/Labs and Tests Ordered: Current medicines are reviewed at length with the patient today.  Concerns regarding medicines are outlined above.  No orders of the defined types were placed in this encounter.  No orders of the defined types were placed in this encounter.   Chief Complaint  Patient presents with  . Follow-up  . Coronary Artery Disease    History of Present Illness:    James Armstrong is a 80 y.o. male with a hx of CAD with PCI / Resolute Drug Eluting Stent of the distal Left Main/Ostial LCF 08/09/16 for troponin normal unstable angina, Dyslipidemia, HTN, S/P CABG in 2003, CVA, with CEA and a small abdominal aneurysm.He had PCI 11/19/2019 of subtotal occlusion of SVG to PDA with a marked clinical improvement.  He was last seen 12/23/2019.  Compliance with diet, lifestyle and medications: Yes  James Armstrong continues to do well Home blood pressure runs 120-130/60-70 no angina dyspnea palpitations syncope has a good  durable result from his PCI and stent and tolerates both his dual antiplatelet therapy without muscle pain or weakness or bleeding along with a statin. Past Medical History:  Diagnosis Date  . Abdominal aortic aneurysm (AAA) 30 to 34 mm in diameter (HCC)   . Abdominal aortic aneurysm (HCC) 02/24/2016  . Asthma   . Carotid stenosis, bilateral 09/25/2018  . Chest pain 02/22/2017  . Coronary artery disease involving native coronary artery of native heart with angina pectoris (HCC) 02/24/2016   CABG in 2004  coronary angiography done 08/09/16 Abnormal Diagnostic Summary Chronic Total Occlusion of the RCA Severe stenosis of the LM, LAD, Circumflex Patent LIMA graft to the mid LAD. Patent SVG graft to the Ramus coronary artery. Moderate disease of SVG graft to the Posterior descending coronary artery. LV not done due to tortuosity of right subclavian Interventional Summary Successful PCI / Resolute Drug Eluting Stent of the distal Left Main/Ostial Circumflex Coronary Artery.      . Coronary artery disease involving native heart with unstable angina pectoris (HCC) 02/24/2016   CABG in 2004  coronary angiography done 08/09/16 Abnormal Diagnostic Summary Chronic Total Occlusion of the RCA Severe stenosis of the LM, LAD, Circumflex Patent LIMA graft to the mid LAD. Patent SVG graft to the Ramus coronary artery. Moderate disease of SVG graft to the Posterior descending coronary artery. LV not done due to tortuosity of right subclavian Interventional Summary Successful PCI /  . Crohn disease (HCC)   .  Essential hypertension 02/24/2016  . GERD (gastroesophageal reflux disease)   . Hx of CABG 02/24/2016   Overview:  In 2003  . Hyperlipemia 02/24/2016  . Mixed dyslipidemia 07/15/2017  . Multiple vessel coronary artery disease 07/15/2017  . Myopathy 02/01/2018  . Presence of stent in coronary artery in patient with coronary artery disease 07/15/2017  . Shortness of breath 08/06/2016  . Sinus bradycardia 08/06/2016  . Stroke  [facility])     Past Surgical History:  Procedure Laterality Date  . CARDIAC CATHETERIZATION    . CAROTID ENDARTERECTOMY    . CHOLECYSTECTOMY    . CORONARY ARTERY BYPASS GRAFT     2004  . CORONARY STENT INTERVENTION N/A 11/19/2019   Procedure: CORONARY STENT INTERVENTION;  Surgeon: Tonny Bollman, MD;  Location: [facility] INVASIVE CV LAB;  Service: Cardiovascular;  Laterality: N/A;  . HERNIA REPAIR    . LEFT HEART CATH AND CORS/GRAFTS ANGIOGRAPHY N/A 07/26/2017   Procedure: LEFT HEART CATH AND CORS/GRAFTS ANGIOGRAPHY;  Surgeon: Kathleene Hazel, MD;  Location: [facility] INVASIVE CV LAB;  Service: Cardiovascular;  Laterality: N/A;  . LEFT HEART CATH AND CORS/GRAFTS ANGIOGRAPHY N/A 11/19/2019   Procedure: LEFT HEART CATH AND CORS/GRAFTS ANGIOGRAPHY;  Surgeon: Tonny Bollman, MD;  Location: [facility] INVASIVE CV LAB;  Service: Cardiovascular;  Laterality: N/A;    Current Medications: Current Meds  Medication Sig  . Ascorbic Acid (VITAMIN C) 1000 MG tablet Take 1,000 mg by mouth daily.   Marland Kitchen aspirin EC 81 MG tablet Take 81 mg by mouth daily.  Marland Kitchen atorvastatin (LIPITOR) 80 MG tablet Take 80 mg by mouth every evening.   . cholecalciferol (VITAMIN D) 25 MCG (1000 UNIT) tablet Take 1,000 Units by mouth daily.  . clopidogrel (PLAVIX) 75 MG tablet Take 75 mg by mouth daily.  Marland Kitchen ELDERBERRY PO Take 1 tablet by mouth daily. Gummie  . escitalopram (LEXAPRO) 5 MG tablet Take 5 mg by mouth every evening.   . ezetimibe (ZETIA) 10 MG tablet Take 10 mg by mouth daily.  . fenofibrate 160 MG tablet Take 160 mg by mouth daily.  Marland Kitchen ibuprofen (ADVIL,MOTRIN) 200 MG tablet Take 400 mg by mouth every 6 (six) hours as needed for headache or moderate pain.  . isosorbide mononitrate (IMDUR) 30 MG 24 hr tablet Take 1 tablet (30 mg total) by mouth daily.  . metoprolol tartrate (LOPRESSOR) 50 MG tablet Take 0.5 tablets (25 mg total) by mouth daily. (Patient taking differently: Take 25 mg by mouth at bedtime.)  . Multiple Vitamin  (MULTIVITAMIN WITH MINERALS) TABS tablet Take 1 tablet by mouth daily.  . nitroGLYCERIN (NITROSTAT) 0.4 MG SL tablet Place 1 tablet (0.4 mg total) under the tongue every 5 (five) minutes as needed for chest pain. (Patient taking differently: Place 0.4 mg under the tongue every 5 (five) minutes x 3 doses as needed for chest pain.)  . Omega-3 Fatty Acids (FISH OIL) 1000 MG CAPS Take 1,000 mg by mouth 2 (two) times daily.   Marland Kitchen omeprazole (PRILOSEC) 20 MG capsule Take 40 mg by mouth at bedtime.     Allergies:   Ranexa [ranolazine]   Social History   Socioeconomic History  . Marital status: Single    Spouse name: Not on file  . Number of children: Not on file  . Years of education: Not on file  . Highest education level: Not on file  Occupational History  . Not on file  Tobacco Use  . Smoking status: Never Smoker  . Smokeless tobacco: Never Used  Vaping Use  . Vaping Use: Never used  Substance and Sexual Activity  . Alcohol use: No  . Drug use: No  . Sexual activity: Not on file  Other Topics Concern  . Not on file  Social History Narrative  . Not on file   Social Determinants of Health   Financial Resource Strain: Not on file  Food Insecurity: Not on file  Transportation Needs: Not on file  Physical Activity: Not on file  Stress: Not on file  Social Connections: Not on file     Family History: The patient's family history includes Diabetes in his brother; Heart attack in his brother; Stroke in his mother. ROS:   Please see the history of present illness.    All other systems reviewed and are negative.  EKGs/Labs/Other Studies Reviewed:    The following studies were reviewed today:  EKG:  EKG ordered today and personally reviewed.  The ekg ordered today demonstrates sinus rhythm and is normal  Recent Labs: 09/22/2019: ALT 28 11/18/2019: BUN 19; Creatinine, Ser 1.10; Hemoglobin 10.5; Platelets 231; Potassium 4.4; Sodium 139  Recent Lipid Panel    Component Value  Date/Time   CHOL 173 06/19/2018 0805   TRIG 102 06/19/2018 0805   HDL 41 06/19/2018 0805   CHOLHDL 4.2 06/19/2018 0805   LDLCALC 112 (H) 06/19/2018 0805    Physical Exam:    VS:  BP (!) 150/60   Pulse 70   Ht 5\' 6"  (1.676 m)   Wt 163 lb (73.9 kg)   SpO2 97%   BMI 26.31 kg/m     Blood pressure 126/70 Wt Readings from Last 3 Encounters:  07/15/20 163 lb (73.9 kg)  12/23/19 157 lb (71.2 kg)  11/19/19 153 lb (69.4 kg)     GEN:  Well nourished, well developed in no acute distress HEENT: Normal NECK: No JVD; No carotid bruits LYMPHATICS: No lymphadenopathy CARDIAC: RRR, no murmurs, rubs, gallops RESPIRATORY:  Clear to auscultation without rales, wheezing or rhonchi  ABDOMEN: Soft, non-tender, non-distended MUSCULOSKELETAL:  No edema; No deformity  SKIN: Warm and dry NEUROLOGIC:  Alert and oriented x 3 PSYCHIATRIC:  Normal affect    Signed, 11/21/19, MD  07/15/2020 1:24 PM    [facility]

## 2020-07-15 ENCOUNTER — Other Ambulatory Visit: Payer: Self-pay

## 2020-07-15 ENCOUNTER — Ambulatory Visit: Payer: Medicare HMO | Admitting: Cardiology

## 2020-07-15 ENCOUNTER — Encounter: Payer: Self-pay | Admitting: Cardiology

## 2020-07-15 VITALS — BP 150/60 | HR 70 | Ht 66.0 in | Wt 163.0 lb

## 2020-07-15 DIAGNOSIS — I714 Abdominal aortic aneurysm, without rupture, unspecified: Secondary | ICD-10-CM

## 2020-07-15 DIAGNOSIS — K5090 Crohn's disease, unspecified, without complications: Secondary | ICD-10-CM | POA: Diagnosis not present

## 2020-07-15 DIAGNOSIS — I10 Essential (primary) hypertension: Secondary | ICD-10-CM | POA: Diagnosis not present

## 2020-07-15 DIAGNOSIS — E7800 Pure hypercholesterolemia, unspecified: Secondary | ICD-10-CM | POA: Diagnosis not present

## 2020-07-15 DIAGNOSIS — I25110 Atherosclerotic heart disease of native coronary artery with unstable angina pectoris: Secondary | ICD-10-CM | POA: Diagnosis not present

## 2020-07-15 NOTE — Patient Instructions (Addendum)
Medication Instructions:  Your physician recommends that you continue on your current medications as directed. Please refer to the Current Medication list given to you today.  *If you need a refill on your cardiac medications before your next appointment, please call your pharmacy*   Lab Work: None  If you have labs (blood work) drawn today and your tests are completely normal, you will receive your results only by: . MyChart Message (if you have MyChart) OR . A paper copy in the mail If you have any lab test that is abnormal or we need to change your treatment, we will call you to review the results.   Testing/Procedures: Your physician has requested that you have an abdominal aorta duplex. During this test, an ultrasound is used to evaluate the aorta. Allow 30 minutes for this exam. Do not eat after midnight the day before and avoid carbonated beverages   Follow-Up: At [facility], you and your health needs are our priority.  As part of our continuing mission to provide you with exceptional heart care, we have created designated Provider Care Teams.  These Care Teams include your primary Cardiologist (physician) and Advanced Practice Providers (APPs -  Physician Assistants and Nurse Practitioners) who all work together to provide you with the care you need, when you need it.  We recommend signing up for the patient portal called "MyChart".  Sign up information is provided on this After Visit Summary.  MyChart is used to connect with patients for Virtual Visits (Telemedicine).  Patients are able to view lab/test results, encounter notes, upcoming appointments, etc.  Non-urgent messages can be sent to your provider as well.   To learn more about what you can do with MyChart, go to [email or web address].    Your next appointment:   6 month(s)  The format for your next appointment:   In Person  Provider:   Brian Munley, MD   Other Instructions   

## 2020-08-12 ENCOUNTER — Other Ambulatory Visit: Payer: Self-pay

## 2020-08-12 ENCOUNTER — Ambulatory Visit (INDEPENDENT_AMBULATORY_CARE_PROVIDER_SITE_OTHER): Payer: Medicare HMO

## 2020-08-12 DIAGNOSIS — I714 Abdominal aortic aneurysm, without rupture, unspecified: Secondary | ICD-10-CM

## 2020-08-12 NOTE — Progress Notes (Signed)
Abdominal aortic duplex exam performed.  Jimmy Sharnae Winfree RDCS, RVT 

## 2020-08-13 ENCOUNTER — Telehealth: Payer: Self-pay

## 2020-08-13 NOTE — Telephone Encounter (Signed)
Spoke with patient regarding results and recommendation.  Patient verbalizes understanding and is agreeable to plan of care. Advised patient to call back with any issues or concerns.  

## 2020-08-13 NOTE — Telephone Encounter (Signed)
-----   Message from Baldo Daub, MD sent at 08/13/2020  1:15 PM EST ----- Good result aneurysm is small stable and I think we can wait a year to recheck

## 2020-08-26 DIAGNOSIS — Z125 Encounter for screening for malignant neoplasm of prostate: Secondary | ICD-10-CM | POA: Diagnosis not present

## 2020-08-26 DIAGNOSIS — Z79899 Other long term (current) drug therapy: Secondary | ICD-10-CM | POA: Diagnosis not present

## 2020-08-26 DIAGNOSIS — E785 Hyperlipidemia, unspecified: Secondary | ICD-10-CM | POA: Diagnosis not present

## 2020-08-26 DIAGNOSIS — Z6826 Body mass index (BMI) 26.0-26.9, adult: Secondary | ICD-10-CM | POA: Diagnosis not present

## 2020-08-26 DIAGNOSIS — I10 Essential (primary) hypertension: Secondary | ICD-10-CM | POA: Diagnosis not present

## 2020-08-26 DIAGNOSIS — I2510 Atherosclerotic heart disease of native coronary artery without angina pectoris: Secondary | ICD-10-CM | POA: Diagnosis not present

## 2021-01-10 NOTE — Progress Notes (Signed)
Cardiology Office Note:    Date:  01/11/2021   ID:  James Armstrong, DOB 30-Dec-1940, MRN [id number]  PCP:  Paulina Fusi, MD  Cardiologist:  Norman Herrlich, MD    Referring MD: Paulina Fusi, MD    ASSESSMENT:    1. Coronary artery disease involving native coronary artery of native heart with unstable angina pectoris (HCC)   2. Hx of CABG   3. Essential hypertension   4. Pure hypercholesterolemia   5. Abdominal aortic aneurysm (AAA) without rupture (HCC)    PLAN:    In order of problems listed above:  James Armstrong continues to do well after remote CABG and recent PCI he has had no anginal discomfort we will keep him on long-term dual antiplatelet therapy with CABG multiple PCI's including aspirin clopidogrel combined lipid-lowering therapy oral nitrates and beta-blocker.  At this time he does not require an ischemia evaluation Blood pressure target continue his current treatment he is not as active as he used to be Lipids are at target continue multidrug combined therapy achieving goals Follow-up duplex to be performed next February we will also check his carotids at that time and I will see him in the office afterwards   Next appointment: February 2023   Medication Adjustments/Labs and Tests Ordered: Current medicines are reviewed at length with the patient today.  Concerns regarding medicines are outlined above.  No orders of the defined types were placed in this encounter.  No orders of the defined types were placed in this encounter.   Chief Complaint  Patient presents with   Follow-up   Coronary Artery Disease     History of Present Illness:    James Armstrong is a 80 y.o. male with a hx of CAD with CABG in 2003 and PCI most recently May 2021 vein graft to the posterior descending artery, hypertension hyperlipidemia history of stroke with CEA and abdominal aortic aneurysm last seen 07/15/2020.  Compliance with diet, lifestyle and medications: Yes  Ultrasound  abdominal aorta 08/13/2020 showed a maximum diameter infrarenal abdominal aorta 3.2 cm stable from his previous study.  Summary:  Abdominal Aorta: There is evidence of abnormal dilatation of the distal  Abdominal aorta. The largest aortic measurement is 3.2 cm. The largest  aortic diameter remains essentially unchanged compared to prior exam.  Previous diameter measurement was 3.2 cm  obtained on 05/06/19.  IVC/Iliac: No evidence of thrombus in IVC and Iliac veins.  James Armstrong continues to do well he has had no angina dyspnea palpitation or syncope and tolerates lipid-lowering therapy fenofibrate Zetia and high intensity statin without muscle pain or weakness.  His most recent lipid profile 08/26/2020 at target cholesterol 162 LDL 95 triglycerides 184 HDL 35.  Hemoglobin 12.7 creatinine 1.17 potassium 4.6.  He has not had any neurologic symptoms for his carotid disease. Past Medical History:  Diagnosis Date   Abdominal aortic aneurysm (AAA) 30 to 34 mm in diameter [facility])    Abdominal aortic aneurysm (HCC) 02/24/2016   Asthma    Carotid stenosis, bilateral 09/25/2018   Chest pain 02/22/2017   Coronary artery disease involving native coronary artery of native heart with angina pectoris (HCC) 02/24/2016   CABG in 2004  coronary angiography done 08/09/16 Abnormal Diagnostic Summary Chronic Total Occlusion of the RCA Severe stenosis of the LM, LAD, Circumflex Patent LIMA graft to the mid LAD. Patent SVG graft to the Ramus coronary artery. Moderate disease of SVG graft to the Posterior descending coronary artery. LV not done due  to tortuosity of right subclavian Interventional Summary Successful PCI / Resolute Drug Eluting Stent of the distal Left Main/Ostial Circumflex Coronary Artery.       Coronary artery disease involving native heart with unstable angina pectoris (HCC) 02/24/2016   CABG in 2004  coronary angiography done 08/09/16 Abnormal Diagnostic Summary Chronic Total Occlusion of the RCA Severe stenosis  of the LM, LAD, Circumflex Patent LIMA graft to the mid LAD. Patent SVG graft to the Ramus coronary artery. Moderate disease of SVG graft to the Posterior descending coronary artery. LV not done due to tortuosity of right subclavian Interventional Summary Successful PCI /   Crohn disease (HCC)    Essential hypertension 02/24/2016   GERD (gastroesophageal reflux disease)    Hx of CABG 02/24/2016   Overview:  In 2003   Hyperlipemia 02/24/2016   Mixed dyslipidemia 07/15/2017   Multiple vessel coronary artery disease 07/15/2017   Myopathy 02/01/2018   Presence of stent in coronary artery in patient with coronary artery disease 07/15/2017   Shortness of breath 08/06/2016   Sinus bradycardia 08/06/2016   Stroke [facility])     Past Surgical History:  Procedure Laterality Date   CARDIAC CATHETERIZATION     CAROTID ENDARTERECTOMY     CHOLECYSTECTOMY     CORONARY ARTERY BYPASS GRAFT     2004   CORONARY STENT INTERVENTION N/A 11/19/2019   Procedure: CORONARY STENT INTERVENTION;  Surgeon: Tonny Bollman, MD;  Location: [facility] INVASIVE CV LAB;  Service: Cardiovascular;  Laterality: N/A;   HERNIA REPAIR     LEFT HEART CATH AND CORS/GRAFTS ANGIOGRAPHY N/A 07/26/2017   Procedure: LEFT HEART CATH AND CORS/GRAFTS ANGIOGRAPHY;  Surgeon: Kathleene Hazel, MD;  Location: [facility] INVASIVE CV LAB;  Service: Cardiovascular;  Laterality: N/A;   LEFT HEART CATH AND CORS/GRAFTS ANGIOGRAPHY N/A 11/19/2019   Procedure: LEFT HEART CATH AND CORS/GRAFTS ANGIOGRAPHY;  Surgeon: Tonny Bollman, MD;  Location: [facility] INVASIVE CV LAB;  Service: Cardiovascular;  Laterality: N/A;    Current Medications: Current Meds  Medication Sig   Ascorbic Acid (VITAMIN C) 1000 MG tablet Take 1,000 mg by mouth daily.    aspirin EC 81 MG tablet Take 81 mg by mouth daily.   atorvastatin (LIPITOR) 80 MG tablet Take 80 mg by mouth every evening.    cholecalciferol (VITAMIN D) 25 MCG (1000 UNIT) tablet Take 1,000 Units by mouth daily.   clopidogrel (PLAVIX)  75 MG tablet Take 75 mg by mouth daily.   ELDERBERRY PO Take 1 tablet by mouth daily. Gummie   escitalopram (LEXAPRO) 5 MG tablet Take 5 mg by mouth every evening.    ezetimibe (ZETIA) 10 MG tablet Take 10 mg by mouth daily.   fenofibrate 160 MG tablet Take 160 mg by mouth daily.   ibuprofen (ADVIL,MOTRIN) 200 MG tablet Take 400 mg by mouth every 6 (six) hours as needed for headache or moderate pain.   isosorbide mononitrate (IMDUR) 30 MG 24 hr tablet Take 1 tablet (30 mg total) by mouth daily.   metoprolol tartrate (LOPRESSOR) 50 MG tablet Take 0.5 tablets (25 mg total) by mouth daily. (Patient taking differently: Take 25 mg by mouth at bedtime.)   Multiple Vitamin (MULTIVITAMIN WITH MINERALS) TABS tablet Take 1 tablet by mouth daily.   nitroGLYCERIN (NITROSTAT) 0.4 MG SL tablet Place 1 tablet (0.4 mg total) under the tongue every 5 (five) minutes as needed for chest pain. (Patient taking differently: Place 0.4 mg under the tongue every 5 (five) minutes x 3 doses as needed for  chest pain.)   Omega-3 Fatty Acids (FISH OIL) 1000 MG CAPS Take 1,000 mg by mouth 2 (two) times daily.    omeprazole (PRILOSEC) 20 MG capsule Take 40 mg by mouth at bedtime.     Allergies:   Atorvastatin, Pravastatin, Ranexa [ranolazine], and Simvastatin   Social History   Socioeconomic History   Marital status: Single    Spouse name: Not on file   Number of children: Not on file   Years of education: Not on file   Highest education level: Not on file  Occupational History   Not on file  Tobacco Use   Smoking status: Never   Smokeless tobacco: Never  Vaping Use   Vaping Use: Never used  Substance and Sexual Activity   Alcohol use: No   Drug use: No   Sexual activity: Not on file  Other Topics Concern   Not on file  Social History Narrative   Not on file   Social Determinants of Health   Financial Resource Strain: Not on file  Food Insecurity: Not on file  Transportation Needs: Not on file  Physical  Activity: Not on file  Stress: Not on file  Social Connections: Not on file     Family History: The patient's family history includes Diabetes in his brother; Heart attack in his brother; Stroke in his mother. ROS:   Please see the history of present illness.    All other systems reviewed and are negative.  EKGs/Labs/Other Studies Reviewed:    The following studies were reviewed today:  EKG:  EKG ordered today and personally reviewed.  The ekg ordered today demonstrates sinus rhythm 1 PVC otherwise normal EKG    Physical Exam:    VS:  BP 128/68 (BP Location: Right Arm, Patient Position: Sitting, Cuff Size: Normal)   Pulse 68   Ht 5\' 6"  (1.676 m)   Wt 162 lb 12.8 oz (73.8 kg)   SpO2 96%   BMI 26.28 kg/m     Wt Readings from Last 3 Encounters:  01/11/21 162 lb 12.8 oz (73.8 kg)  07/15/20 163 lb (73.9 kg)  12/23/19 157 lb (71.2 kg)     GEN:  Well nourished, well developed in no acute distress HEENT: Normal NECK: No JVD; No carotid bruits LYMPHATICS: No lymphadenopathy CARDIAC: RRR, no murmurs, rubs, gallops RESPIRATORY:  Clear to auscultation without rales, wheezing or rhonchi  ABDOMEN: Soft, non-tender, non-distended MUSCULOSKELETAL:  No edema; No deformity  SKIN: Warm and dry NEUROLOGIC:  Alert and oriented x 3 PSYCHIATRIC:  Normal affect    Signed, 12/25/19, MD  01/11/2021 8:42 AM    [facility]

## 2021-01-11 ENCOUNTER — Ambulatory Visit: Payer: Medicare HMO | Admitting: Cardiology

## 2021-01-11 ENCOUNTER — Other Ambulatory Visit: Payer: Self-pay

## 2021-01-11 ENCOUNTER — Encounter: Payer: Self-pay | Admitting: Cardiology

## 2021-01-11 VITALS — BP 128/68 | HR 68 | Ht 66.0 in | Wt 162.8 lb

## 2021-01-11 DIAGNOSIS — I714 Abdominal aortic aneurysm, without rupture, unspecified: Secondary | ICD-10-CM

## 2021-01-11 DIAGNOSIS — I10 Essential (primary) hypertension: Secondary | ICD-10-CM

## 2021-01-11 DIAGNOSIS — E7800 Pure hypercholesterolemia, unspecified: Secondary | ICD-10-CM | POA: Diagnosis not present

## 2021-01-11 DIAGNOSIS — Z951 Presence of aortocoronary bypass graft: Secondary | ICD-10-CM | POA: Diagnosis not present

## 2021-01-11 DIAGNOSIS — I25110 Atherosclerotic heart disease of native coronary artery with unstable angina pectoris: Secondary | ICD-10-CM | POA: Diagnosis not present

## 2021-01-11 NOTE — Patient Instructions (Signed)
Medication Instructions:  No medication changes. *If you need a refill on your cardiac medications before your next appointment, please call your pharmacy*   Lab Work: None ordered If you have labs (blood work) drawn today and your tests are completely normal, you will receive your results only by: MyChart Message (if you have MyChart) OR A paper copy in the mail If you have any lab test that is abnormal or we need to change your treatment, we will call you to review the results.   Testing/Procedures: Your physician has requested that you have an abdominal aorta duplex. During this test, an ultrasound is used to evaluate the aorta. Allow 30 minutes for this exam. Do not eat after midnight the day before and avoid carbonated beverages   Your physician has requested that you have a carotid duplex. This test is an ultrasound of the carotid arteries in your neck. It looks at blood flow through these arteries that supply the brain with blood. Allow one hour for this exam. There are no restrictions or special instructions.   Follow-Up: At [facility], you and your health needs are our priority.  As part of our continuing mission to provide you with exceptional heart care, we have created designated Provider Care Teams.  These Care Teams include your primary Cardiologist (physician) and Advanced Practice Providers (APPs -  Physician Assistants and Nurse Practitioners) who all work together to provide you with the care you need, when you need it.  We recommend signing up for the patient portal called "MyChart".  Sign up information is provided on this After Visit Summary.  MyChart is used to connect with patients for Virtual Visits (Telemedicine).  Patients are able to view lab/test results, encounter notes, upcoming appointments, etc.  Non-urgent messages can be sent to your provider as well.   To learn more about what you can do with MyChart, go to [email or web address].    Your next  appointment:   7-8 months  The format for your next appointment:   In Person  Provider:   Norman Herrlich, MD   Other Instructions NA

## 2021-01-16 ENCOUNTER — Other Ambulatory Visit: Payer: Self-pay | Admitting: Cardiology

## 2021-01-18 NOTE — Telephone Encounter (Signed)
Isosorbide mononitrate 30 mg # 90 x 3 refills sent to [facility] [address] [facility], [place] - [address]

## 2021-01-23 ENCOUNTER — Other Ambulatory Visit: Payer: Self-pay | Admitting: Cardiology

## 2021-01-27 ENCOUNTER — Other Ambulatory Visit: Payer: Self-pay | Admitting: Cardiology

## 2021-03-04 DIAGNOSIS — Z79899 Other long term (current) drug therapy: Secondary | ICD-10-CM | POA: Diagnosis not present

## 2021-03-04 DIAGNOSIS — E785 Hyperlipidemia, unspecified: Secondary | ICD-10-CM | POA: Diagnosis not present

## 2021-03-04 DIAGNOSIS — I10 Essential (primary) hypertension: Secondary | ICD-10-CM | POA: Diagnosis not present

## 2021-03-04 DIAGNOSIS — Z6826 Body mass index (BMI) 26.0-26.9, adult: Secondary | ICD-10-CM | POA: Diagnosis not present

## 2021-03-04 DIAGNOSIS — I2510 Atherosclerotic heart disease of native coronary artery without angina pectoris: Secondary | ICD-10-CM | POA: Diagnosis not present

## 2021-05-04 DIAGNOSIS — I700 Atherosclerosis of aorta: Secondary | ICD-10-CM | POA: Diagnosis not present

## 2021-05-04 DIAGNOSIS — R109 Unspecified abdominal pain: Secondary | ICD-10-CM | POA: Diagnosis not present

## 2021-05-04 DIAGNOSIS — K5730 Diverticulosis of large intestine without perforation or abscess without bleeding: Secondary | ICD-10-CM | POA: Diagnosis not present

## 2021-07-15 ENCOUNTER — Telehealth: Payer: Self-pay

## 2021-07-15 ENCOUNTER — Ambulatory Visit (INDEPENDENT_AMBULATORY_CARE_PROVIDER_SITE_OTHER): Payer: Medicare HMO

## 2021-07-15 ENCOUNTER — Other Ambulatory Visit: Payer: Self-pay

## 2021-07-15 DIAGNOSIS — I25110 Atherosclerotic heart disease of native coronary artery with unstable angina pectoris: Secondary | ICD-10-CM

## 2021-07-15 DIAGNOSIS — I7141 Pararenal abdominal aortic aneurysm, without rupture: Secondary | ICD-10-CM | POA: Diagnosis not present

## 2021-07-15 DIAGNOSIS — I714 Abdominal aortic aneurysm, without rupture, unspecified: Secondary | ICD-10-CM

## 2021-07-15 DIAGNOSIS — Z951 Presence of aortocoronary bypass graft: Secondary | ICD-10-CM

## 2021-07-15 DIAGNOSIS — I6523 Occlusion and stenosis of bilateral carotid arteries: Secondary | ICD-10-CM | POA: Diagnosis not present

## 2021-07-15 DIAGNOSIS — E7800 Pure hypercholesterolemia, unspecified: Secondary | ICD-10-CM

## 2021-07-15 NOTE — Telephone Encounter (Signed)
-----   Message from Richardo Priest, MD sent at 07/15/2021  3:44 PM EST ----- Carotids are good no change since 2019  Some increase in the size of abdominal aortic aneurysm but its not severe Previous measurements its been as high as 35 mm.  I like to do a repeat in 6 months just to see a rate of progression and I do not think he requires referral to vascular surgery

## 2021-07-15 NOTE — Telephone Encounter (Signed)
Left message on patients voicemail to please return our call.   

## 2021-07-16 ENCOUNTER — Telehealth: Payer: Self-pay

## 2021-07-16 DIAGNOSIS — I714 Abdominal aortic aneurysm, without rupture, unspecified: Secondary | ICD-10-CM

## 2021-07-16 NOTE — Telephone Encounter (Signed)
-----   Message from Brian J Munley, MD sent at 07/15/2021  3:44 PM EST ----- °Carotids are good no change since 2019 ° °Some increase in the size of abdominal aortic aneurysm but its not severe °Previous measurements its been as high as 35 mm. ° °I like to do a repeat in 6 months just to see a rate of progression and I do not think he requires referral to vascular surgery °

## 2021-07-16 NOTE — Telephone Encounter (Signed)
Spoke with patient regarding results and recommendation.  Patient verbalizes understanding and is agreeable to plan of care. Advised patient to call back with any issues or concerns.  

## 2021-08-03 NOTE — Progress Notes (Signed)
Cardiology Office Note:    Date:  08/04/2021   ID:  CHED NEDROW, DOB January 20, 1941, MRN [id number]  PCP:  Nicoletta Dress, MD  Cardiologist:  Shirlee More, MD    Referring MD: Nicoletta Dress, MD    ASSESSMENT:    1. Coronary artery disease involving native coronary artery of native heart with unstable angina pectoris ([place])   2. Essential hypertension   3. Pure hypercholesterolemia   4. Abdominal aortic aneurysm (AAA) 30 to 34 mm in diameter   5. Carotid stenosis, bilateral    PLAN:    In order of problems listed above:  Nyjah continues to do well with CAD following CABG and PCI most recently May 2021 he is having no angina on current medical therapy and will continue aspirin clopidogrel oral nitrate beta-blocker and lipid-lowering. BP at target continue current treatment Continue his high intensity statin with CAD We will do a follow-up CT of the abdomen at 6 months I think we have a better coaxial measurements and really define the rate of changes abdominal aorta continues to be greater than 5 mm between measurements I will refer him to vascular surgery Stable on recent duplex asymptomatic   Next appointment: 9 months   Medication Adjustments/Labs and Tests Ordered: Current medicines are reviewed at length with the patient today.  Concerns regarding medicines are outlined above.  Orders Placed This Encounter  Procedures   CT ABDOMEN WO CONTRAST   No orders of the defined types were placed in this encounter.   Chief Complaint  Patient presents with   Follow-up   Coronary Artery Disease    History of Present Illness:    SACARIO REETZ is a 81 y.o. male with a hx of  CAD with CABG in 2003 and PCI most recently May 2021 vein graft to the posterior descending artery, hypertension hyperlipidemia history of stroke with CEA and abdominal aortic aneurysm  last seen 01/11/2021.  Compliance with diet, lifestyle and medications: Yes  Tremon continues to do well since  his last PCI he has noted no angina shortness of breath exercise intolerance chest pain palpitation or syncope. Tolerates his statin without muscle pain or weakness He has been progression on his abdominal duplex normal chest diameter his aorta by CT this summer he has had no abdominal pain  Duplex abdominal aorta 07/15/2021 showed aneurysm largest diameter 3.9 cm with progression from the previous study done 1 year prior 3.2 cm. Carotid duplex 07/15/2021 showed right ICA stenosis 40 to 59% left ICA stenosis 40 to 59% normal vertebral and subclavian flow bilaterally. Past Medical History:  Diagnosis Date   Abdominal aortic aneurysm 02/24/2016   Abdominal aortic aneurysm (AAA) 30 to 34 mm in diameter    Asthma    Carotid stenosis, bilateral 09/25/2018   Chest pain 02/22/2017   Coronary artery disease involving native coronary artery of native heart with angina pectoris ([place]) 02/24/2016   CABG in 2004  coronary angiography done 08/09/16 Abnormal Diagnostic Summary Chronic Total Occlusion of the RCA Severe stenosis of the LM, LAD, Circumflex Patent LIMA graft to the mid LAD. Patent SVG graft to the Ramus coronary artery. Moderate disease of SVG graft to the Posterior descending coronary artery. LV not done due to tortuosity of right subclavian Interventional Summary Successful PCI / Resolute Drug Eluting Stent of the distal Left Main/Ostial Circumflex Coronary Artery.       Coronary artery disease involving native heart with unstable angina pectoris ([place]) 02/24/2016   CABG in  2004  coronary angiography done 08/09/16 Abnormal Diagnostic Summary Chronic Total Occlusion of the RCA Severe stenosis of the LM, LAD, Circumflex Patent LIMA graft to the mid LAD. Patent SVG graft to the Ramus coronary artery. Moderate disease of SVG graft to the Posterior descending coronary artery. LV not done due to tortuosity of right subclavian Interventional Summary Successful PCI /   Crohn disease ([place])    Essential hypertension  02/24/2016   GERD (gastroesophageal reflux disease)    Hx of CABG 02/24/2016   Overview:  In 2003   Hyperlipemia 02/24/2016   Mixed dyslipidemia 07/15/2017   Multiple vessel coronary artery disease 07/15/2017   Myopathy 02/01/2018   Presence of stent in coronary artery in patient with coronary artery disease 07/15/2017   Shortness of breath 08/06/2016   Sinus bradycardia 08/06/2016   Stroke [facility])     Past Surgical History:  Procedure Laterality Date   CARDIAC CATHETERIZATION     CAROTID ENDARTERECTOMY     CHOLECYSTECTOMY     CORONARY ARTERY BYPASS GRAFT     2004   CORONARY STENT INTERVENTION N/A 11/19/2019   Procedure: CORONARY STENT INTERVENTION;  Surgeon: Sherren Mocha, MD;  Location: [place] CV LAB;  Service: Cardiovascular;  Laterality: N/A;   HERNIA REPAIR     LEFT HEART CATH AND CORS/GRAFTS ANGIOGRAPHY N/A 07/26/2017   Procedure: LEFT HEART CATH AND CORS/GRAFTS ANGIOGRAPHY;  Surgeon: Burnell Blanks, MD;  Location: [place] CV LAB;  Service: Cardiovascular;  Laterality: N/A;   LEFT HEART CATH AND CORS/GRAFTS ANGIOGRAPHY N/A 11/19/2019   Procedure: LEFT HEART CATH AND CORS/GRAFTS ANGIOGRAPHY;  Surgeon: Sherren Mocha, MD;  Location: [place] CV LAB;  Service: Cardiovascular;  Laterality: N/A;    Current Medications: Current Meds  Medication Sig   Ascorbic Acid (VITAMIN C) 1000 MG tablet Take 1,000 mg by mouth daily.    aspirin EC 81 MG tablet Take 81 mg by mouth daily.   atorvastatin (LIPITOR) 80 MG tablet Take 80 mg by mouth every evening.    cholecalciferol (VITAMIN D) 25 MCG (1000 UNIT) tablet Take 1,000 Units by mouth daily.   clopidogrel (PLAVIX) 75 MG tablet Take 75 mg by mouth daily.   ELDERBERRY PO Take 1 tablet by mouth daily. Gummie   escitalopram (LEXAPRO) 5 MG tablet Take 5 mg by mouth every evening.    ezetimibe (ZETIA) 10 MG tablet Take 10 mg by mouth daily.   fenofibrate 160 MG tablet Take 160 mg by mouth daily.   ibuprofen (ADVIL,MOTRIN) 200 MG  tablet Take 400 mg by mouth every 6 (six) hours as needed for headache or moderate pain.   isosorbide mononitrate (IMDUR) 30 MG 24 hr tablet Take 1 tablet by mouth once daily (Patient taking differently: Take 30 mg by mouth daily.)   metoprolol tartrate (LOPRESSOR) 50 MG tablet Take 0.5 tablets (25 mg total) by mouth daily. (Patient taking differently: Take 25 mg by mouth at bedtime.)   Multiple Vitamin (MULTIVITAMIN WITH MINERALS) TABS tablet Take 1 tablet by mouth daily.   nitroGLYCERIN (NITROSTAT) 0.4 MG SL tablet Place 1 tablet (0.4 mg total) under the tongue every 5 (five) minutes as needed for chest pain. (Patient taking differently: Place 0.4 mg under the tongue every 5 (five) minutes x 3 doses as needed for chest pain.)   Omega-3 Fatty Acids (FISH OIL) 1000 MG CAPS Take 1,000 mg by mouth 2 (two) times daily.    omeprazole (PRILOSEC) 20 MG capsule Take 40 mg by mouth at bedtime.  Allergies:   Atorvastatin, Pravastatin, Ranexa [ranolazine], and Simvastatin   Social History   Socioeconomic History   Marital status: Single    Spouse name: Not on file   Number of children: Not on file   Years of education: Not on file   Highest education level: Not on file  Occupational History   Not on file  Tobacco Use   Smoking status: Never   Smokeless tobacco: Never  Vaping Use   Vaping Use: Never used  Substance and Sexual Activity   Alcohol use: No   Drug use: No   Sexual activity: Not on file  Other Topics Concern   Not on file  Social History Narrative   Not on file   Social Determinants of Health   Financial Resource Strain: Not on file  Food Insecurity: Not on file  Transportation Needs: Not on file  Physical Activity: Not on file  Stress: Not on file  Social Connections: Not on file     Family History: The patient's family history includes Diabetes in his brother; Heart attack in his brother; Stroke in his mother. ROS:   Please see the history of present illness.     All other systems reviewed and are negative.  EKGs/Labs/Other Studies Reviewed:    The following studies were reviewed today:  03/04/2021: Cholesterol 176 LDL 98 triglycerides 251 HDL 35 creatinine 1.1   Physical Exam:    VS:  BP (!) 122/58 (BP Location: Right Arm, Patient Position: Sitting)    Pulse (!) 54    Ht 5\' 7"  (1.702 m)    Wt 163 lb 9.6 oz (74.2 kg)    SpO2 96%    BMI 25.62 kg/m     Wt Readings from Last 3 Encounters:  08/04/21 163 lb 9.6 oz (74.2 kg)  01/11/21 162 lb 12.8 oz (73.8 kg)  07/15/20 163 lb (73.9 kg)     GEN: Looks younger than his age well nourished, well developed in no acute distress HEENT: Normal NECK: No JVD; No carotid bruits LYMPHATICS: No lymphadenopathy CARDIAC: RRR, no murmurs, rubs, gallops RESPIRATORY:  Clear to auscultation without rales, wheezing or rhonchi  ABDOMEN: Soft, non-tender, non-distended MUSCULOSKELETAL:  No edema; No deformity  SKIN: Warm and dry NEUROLOGIC:  Alert and oriented x 3 PSYCHIATRIC:  Normal affect    Signed, Shirlee More, MD  08/04/2021 9:35 AM    [place]

## 2021-08-04 ENCOUNTER — Encounter: Payer: Self-pay | Admitting: Cardiology

## 2021-08-04 ENCOUNTER — Other Ambulatory Visit: Payer: Self-pay

## 2021-08-04 ENCOUNTER — Ambulatory Visit (INDEPENDENT_AMBULATORY_CARE_PROVIDER_SITE_OTHER): Payer: Medicare HMO | Admitting: Cardiology

## 2021-08-04 VITALS — BP 122/58 | HR 54 | Ht 67.0 in | Wt 163.6 lb

## 2021-08-04 DIAGNOSIS — I714 Abdominal aortic aneurysm, without rupture, unspecified: Secondary | ICD-10-CM | POA: Diagnosis not present

## 2021-08-04 DIAGNOSIS — E7800 Pure hypercholesterolemia, unspecified: Secondary | ICD-10-CM

## 2021-08-04 DIAGNOSIS — I10 Essential (primary) hypertension: Secondary | ICD-10-CM | POA: Diagnosis not present

## 2021-08-04 DIAGNOSIS — I25110 Atherosclerotic heart disease of native coronary artery with unstable angina pectoris: Secondary | ICD-10-CM | POA: Diagnosis not present

## 2021-08-04 DIAGNOSIS — Z0001 Encounter for general adult medical examination with abnormal findings: Secondary | ICD-10-CM | POA: Insufficient documentation

## 2021-08-04 DIAGNOSIS — I6523 Occlusion and stenosis of bilateral carotid arteries: Secondary | ICD-10-CM

## 2021-08-04 DIAGNOSIS — E663 Overweight: Secondary | ICD-10-CM | POA: Insufficient documentation

## 2021-08-04 DIAGNOSIS — H9190 Unspecified hearing loss, unspecified ear: Secondary | ICD-10-CM | POA: Insufficient documentation

## 2021-08-04 DIAGNOSIS — K5090 Crohn's disease, unspecified, without complications: Secondary | ICD-10-CM | POA: Diagnosis not present

## 2021-08-04 NOTE — Patient Instructions (Signed)
Medication Instructions:  Your physician recommends that you continue on your current medications as directed. Please refer to the Current Medication list given to you today.  *If you need a refill on your cardiac medications before your next appointment, please call your pharmacy*   Lab Work: None If you have labs (blood work) drawn today and your tests are completely normal, you will receive your results only by: [place] (if you have MyChart) OR A paper copy in the mail If you have any lab test that is abnormal or we need to change your treatment, we will call you to review the results.   Testing/Procedures: We have placed an order for you to have a CT of your abdomen in July. They will call you closer to time to schedule this appointment.    Follow-Up: At [facility], you and your health needs are our priority.  As part of our continuing mission to provide you with exceptional heart care, we have created designated Provider Care Teams.  These Care Teams include your primary Cardiologist (physician) and Advanced Practice Providers (APPs -  Physician Assistants and Nurse Practitioners) who all work together to provide you with the care you need, when you need it.  We recommend signing up for the patient portal called "MyChart".  Sign up information is provided on this After Visit Summary.  MyChart is used to connect with patients for Virtual Visits (Telemedicine).  Patients are able to view lab/test results, encounter notes, upcoming appointments, etc.  Non-urgent messages can be sent to your provider as well.   To learn more about what you can do with MyChart, go to [email or web address].    Your next appointment:   9 month(s)  The format for your next appointment:   In Person  Provider:   Shirlee More, MD    Other Instructions

## 2021-08-09 ENCOUNTER — Other Ambulatory Visit: Payer: Self-pay | Admitting: Family

## 2021-08-09 DIAGNOSIS — I25119 Atherosclerotic heart disease of native coronary artery with unspecified angina pectoris: Secondary | ICD-10-CM

## 2021-08-13 ENCOUNTER — Ambulatory Visit (HOSPITAL_BASED_OUTPATIENT_CLINIC_OR_DEPARTMENT_OTHER)
Admission: RE | Admit: 2021-08-13 | Discharge: 2021-08-13 | Disposition: A | Discharge status: Home / Self Care | Payer: Medicare HMO | Source: Ambulatory Visit | Attending: Cardiology | Admitting: Cardiology

## 2021-08-13 ENCOUNTER — Other Ambulatory Visit: Payer: Self-pay

## 2021-08-13 DIAGNOSIS — N201 Calculus of ureter: Secondary | ICD-10-CM | POA: Diagnosis not present

## 2021-08-13 DIAGNOSIS — I714 Abdominal aortic aneurysm, without rupture, unspecified: Secondary | ICD-10-CM | POA: Diagnosis not present

## 2021-08-13 DIAGNOSIS — I7143 Infrarenal abdominal aortic aneurysm, without rupture: Secondary | ICD-10-CM | POA: Diagnosis not present

## 2021-08-13 DIAGNOSIS — K760 Fatty (change of) liver, not elsewhere classified: Secondary | ICD-10-CM | POA: Diagnosis not present

## 2021-08-17 ENCOUNTER — Telehealth: Payer: Self-pay

## 2021-08-17 NOTE — Telephone Encounter (Signed)
-----   Message from Baldo Daub, MD sent at 08/16/2021  4:05 PM EST ----- Regarding: FW: Good result no changes from previous CT ----- Message ----- From: Interface, Rad Results In Sent: 08/15/2021   4:48 PM EST To: Baldo Daub, MD

## 2021-08-17 NOTE — Telephone Encounter (Signed)
Patient notified of results.

## 2021-09-02 DIAGNOSIS — E785 Hyperlipidemia, unspecified: Secondary | ICD-10-CM | POA: Diagnosis not present

## 2021-09-02 DIAGNOSIS — I10 Essential (primary) hypertension: Secondary | ICD-10-CM | POA: Diagnosis not present

## 2021-09-02 DIAGNOSIS — Z79899 Other long term (current) drug therapy: Secondary | ICD-10-CM | POA: Diagnosis not present

## 2021-09-02 DIAGNOSIS — I2510 Atherosclerotic heart disease of native coronary artery without angina pectoris: Secondary | ICD-10-CM | POA: Diagnosis not present

## 2021-09-02 DIAGNOSIS — I714 Abdominal aortic aneurysm, without rupture, unspecified: Secondary | ICD-10-CM | POA: Diagnosis not present

## 2021-09-02 DIAGNOSIS — Z125 Encounter for screening for malignant neoplasm of prostate: Secondary | ICD-10-CM | POA: Diagnosis not present

## 2021-09-02 DIAGNOSIS — Z139 Encounter for screening, unspecified: Secondary | ICD-10-CM | POA: Diagnosis not present

## 2021-12-01 DIAGNOSIS — L814 Other melanin hyperpigmentation: Secondary | ICD-10-CM | POA: Diagnosis not present

## 2021-12-01 DIAGNOSIS — L578 Other skin changes due to chronic exposure to nonionizing radiation: Secondary | ICD-10-CM | POA: Diagnosis not present

## 2021-12-01 DIAGNOSIS — L821 Other seborrheic keratosis: Secondary | ICD-10-CM | POA: Diagnosis not present

## 2021-12-20 DIAGNOSIS — M5431 Sciatica, right side: Secondary | ICD-10-CM | POA: Diagnosis not present

## 2021-12-27 DIAGNOSIS — M5441 Lumbago with sciatica, right side: Secondary | ICD-10-CM | POA: Diagnosis not present

## 2021-12-27 DIAGNOSIS — M4126 Other idiopathic scoliosis, lumbar region: Secondary | ICD-10-CM | POA: Diagnosis not present

## 2021-12-27 DIAGNOSIS — I7143 Infrarenal abdominal aortic aneurysm, without rupture: Secondary | ICD-10-CM | POA: Diagnosis not present

## 2021-12-27 DIAGNOSIS — M47816 Spondylosis without myelopathy or radiculopathy, lumbar region: Secondary | ICD-10-CM | POA: Diagnosis not present

## 2022-01-05 DIAGNOSIS — M47817 Spondylosis without myelopathy or radiculopathy, lumbosacral region: Secondary | ICD-10-CM | POA: Diagnosis not present

## 2022-01-05 DIAGNOSIS — M5136 Other intervertebral disc degeneration, lumbar region: Secondary | ICD-10-CM | POA: Diagnosis not present

## 2022-01-05 DIAGNOSIS — M5416 Radiculopathy, lumbar region: Secondary | ICD-10-CM | POA: Diagnosis not present

## 2022-01-14 DIAGNOSIS — M5416 Radiculopathy, lumbar region: Secondary | ICD-10-CM | POA: Diagnosis not present

## 2022-01-14 DIAGNOSIS — M545 Low back pain, unspecified: Secondary | ICD-10-CM | POA: Diagnosis not present

## 2022-01-14 DIAGNOSIS — M13851 Other specified arthritis, right hip: Secondary | ICD-10-CM | POA: Diagnosis not present

## 2022-01-14 DIAGNOSIS — M13861 Other specified arthritis, right knee: Secondary | ICD-10-CM | POA: Diagnosis not present

## 2022-01-19 DIAGNOSIS — M13851 Other specified arthritis, right hip: Secondary | ICD-10-CM | POA: Diagnosis not present

## 2022-01-19 DIAGNOSIS — M545 Low back pain, unspecified: Secondary | ICD-10-CM | POA: Diagnosis not present

## 2022-01-19 DIAGNOSIS — M13861 Other specified arthritis, right knee: Secondary | ICD-10-CM | POA: Diagnosis not present

## 2022-01-19 DIAGNOSIS — M5416 Radiculopathy, lumbar region: Secondary | ICD-10-CM | POA: Diagnosis not present

## 2022-01-21 DIAGNOSIS — M5416 Radiculopathy, lumbar region: Secondary | ICD-10-CM | POA: Diagnosis not present

## 2022-01-21 DIAGNOSIS — M545 Low back pain, unspecified: Secondary | ICD-10-CM | POA: Diagnosis not present

## 2022-01-21 DIAGNOSIS — M13851 Other specified arthritis, right hip: Secondary | ICD-10-CM | POA: Diagnosis not present

## 2022-01-21 DIAGNOSIS — M13861 Other specified arthritis, right knee: Secondary | ICD-10-CM | POA: Diagnosis not present

## 2022-01-22 ENCOUNTER — Other Ambulatory Visit: Payer: Self-pay | Admitting: Cardiology

## 2022-01-27 ENCOUNTER — Ambulatory Visit (INDEPENDENT_AMBULATORY_CARE_PROVIDER_SITE_OTHER): Payer: Medicare HMO

## 2022-01-27 DIAGNOSIS — I7143 Infrarenal abdominal aortic aneurysm, without rupture: Secondary | ICD-10-CM

## 2022-01-27 DIAGNOSIS — M13851 Other specified arthritis, right hip: Secondary | ICD-10-CM | POA: Diagnosis not present

## 2022-01-27 DIAGNOSIS — M5416 Radiculopathy, lumbar region: Secondary | ICD-10-CM | POA: Diagnosis not present

## 2022-01-27 DIAGNOSIS — M545 Low back pain, unspecified: Secondary | ICD-10-CM | POA: Diagnosis not present

## 2022-01-27 DIAGNOSIS — M13861 Other specified arthritis, right knee: Secondary | ICD-10-CM | POA: Diagnosis not present

## 2022-01-27 DIAGNOSIS — I714 Abdominal aortic aneurysm, without rupture, unspecified: Secondary | ICD-10-CM

## 2022-01-30 ENCOUNTER — Other Ambulatory Visit: Payer: Self-pay | Admitting: Cardiology

## 2022-02-03 DIAGNOSIS — M5416 Radiculopathy, lumbar region: Secondary | ICD-10-CM | POA: Diagnosis not present

## 2022-02-03 DIAGNOSIS — M13851 Other specified arthritis, right hip: Secondary | ICD-10-CM | POA: Diagnosis not present

## 2022-02-03 DIAGNOSIS — M13861 Other specified arthritis, right knee: Secondary | ICD-10-CM | POA: Diagnosis not present

## 2022-02-03 DIAGNOSIS — M545 Low back pain, unspecified: Secondary | ICD-10-CM | POA: Diagnosis not present

## 2022-02-07 DIAGNOSIS — M545 Low back pain, unspecified: Secondary | ICD-10-CM | POA: Diagnosis not present

## 2022-02-07 DIAGNOSIS — M13861 Other specified arthritis, right knee: Secondary | ICD-10-CM | POA: Diagnosis not present

## 2022-02-07 DIAGNOSIS — M13851 Other specified arthritis, right hip: Secondary | ICD-10-CM | POA: Diagnosis not present

## 2022-02-07 DIAGNOSIS — M5416 Radiculopathy, lumbar region: Secondary | ICD-10-CM | POA: Diagnosis not present

## 2022-02-10 DIAGNOSIS — M5416 Radiculopathy, lumbar region: Secondary | ICD-10-CM | POA: Diagnosis not present

## 2022-02-10 DIAGNOSIS — M13851 Other specified arthritis, right hip: Secondary | ICD-10-CM | POA: Diagnosis not present

## 2022-02-10 DIAGNOSIS — M545 Low back pain, unspecified: Secondary | ICD-10-CM | POA: Diagnosis not present

## 2022-02-10 DIAGNOSIS — M13861 Other specified arthritis, right knee: Secondary | ICD-10-CM | POA: Diagnosis not present

## 2022-02-14 DIAGNOSIS — M545 Low back pain, unspecified: Secondary | ICD-10-CM | POA: Diagnosis not present

## 2022-02-14 DIAGNOSIS — M13851 Other specified arthritis, right hip: Secondary | ICD-10-CM | POA: Diagnosis not present

## 2022-02-14 DIAGNOSIS — M13861 Other specified arthritis, right knee: Secondary | ICD-10-CM | POA: Diagnosis not present

## 2022-02-14 DIAGNOSIS — M5416 Radiculopathy, lumbar region: Secondary | ICD-10-CM | POA: Diagnosis not present

## 2022-02-17 DIAGNOSIS — M5416 Radiculopathy, lumbar region: Secondary | ICD-10-CM | POA: Diagnosis not present

## 2022-02-17 DIAGNOSIS — M13861 Other specified arthritis, right knee: Secondary | ICD-10-CM | POA: Diagnosis not present

## 2022-02-17 DIAGNOSIS — M13851 Other specified arthritis, right hip: Secondary | ICD-10-CM | POA: Diagnosis not present

## 2022-02-17 DIAGNOSIS — M545 Low back pain, unspecified: Secondary | ICD-10-CM | POA: Diagnosis not present

## 2022-02-21 DIAGNOSIS — M5416 Radiculopathy, lumbar region: Secondary | ICD-10-CM | POA: Diagnosis not present

## 2022-02-21 DIAGNOSIS — M545 Low back pain, unspecified: Secondary | ICD-10-CM | POA: Diagnosis not present

## 2022-02-21 DIAGNOSIS — M13851 Other specified arthritis, right hip: Secondary | ICD-10-CM | POA: Diagnosis not present

## 2022-02-21 DIAGNOSIS — M13861 Other specified arthritis, right knee: Secondary | ICD-10-CM | POA: Diagnosis not present

## 2022-02-24 DIAGNOSIS — M13861 Other specified arthritis, right knee: Secondary | ICD-10-CM | POA: Diagnosis not present

## 2022-02-24 DIAGNOSIS — M5416 Radiculopathy, lumbar region: Secondary | ICD-10-CM | POA: Diagnosis not present

## 2022-02-24 DIAGNOSIS — M545 Low back pain, unspecified: Secondary | ICD-10-CM | POA: Diagnosis not present

## 2022-02-24 DIAGNOSIS — M13851 Other specified arthritis, right hip: Secondary | ICD-10-CM | POA: Diagnosis not present

## 2022-03-02 DIAGNOSIS — M5416 Radiculopathy, lumbar region: Secondary | ICD-10-CM | POA: Diagnosis not present

## 2022-03-02 DIAGNOSIS — M545 Low back pain, unspecified: Secondary | ICD-10-CM | POA: Diagnosis not present

## 2022-03-02 DIAGNOSIS — M13861 Other specified arthritis, right knee: Secondary | ICD-10-CM | POA: Diagnosis not present

## 2022-03-02 DIAGNOSIS — M13851 Other specified arthritis, right hip: Secondary | ICD-10-CM | POA: Diagnosis not present

## 2022-03-08 DIAGNOSIS — I2510 Atherosclerotic heart disease of native coronary artery without angina pectoris: Secondary | ICD-10-CM | POA: Diagnosis not present

## 2022-03-08 DIAGNOSIS — Z6825 Body mass index (BMI) 25.0-25.9, adult: Secondary | ICD-10-CM | POA: Diagnosis not present

## 2022-03-08 DIAGNOSIS — I714 Abdominal aortic aneurysm, without rupture, unspecified: Secondary | ICD-10-CM | POA: Diagnosis not present

## 2022-03-08 DIAGNOSIS — Z79899 Other long term (current) drug therapy: Secondary | ICD-10-CM | POA: Diagnosis not present

## 2022-03-08 DIAGNOSIS — E785 Hyperlipidemia, unspecified: Secondary | ICD-10-CM | POA: Diagnosis not present

## 2022-03-08 DIAGNOSIS — I10 Essential (primary) hypertension: Secondary | ICD-10-CM | POA: Diagnosis not present

## 2022-04-11 DIAGNOSIS — J029 Acute pharyngitis, unspecified: Secondary | ICD-10-CM | POA: Diagnosis not present

## 2022-04-11 DIAGNOSIS — R059 Cough, unspecified: Secondary | ICD-10-CM | POA: Diagnosis not present

## 2022-04-11 DIAGNOSIS — R0981 Nasal congestion: Secondary | ICD-10-CM | POA: Diagnosis not present

## 2022-05-05 ENCOUNTER — Ambulatory Visit: Payer: Medicare HMO | Attending: Cardiology | Admitting: Cardiology

## 2022-05-05 NOTE — Progress Notes (Deleted)
Cardiology Office Note:    Date:  05/05/2022   ID:  [name], DOB December 13, 1940, MRN [id number]  PCP:  Paulina Fusi, MD  Cardiologist:  Norman Herrlich, MD    Referring MD: Paulina Fusi, MD    ASSESSMENT:    No diagnosis found. PLAN:    In order of problems listed above:  ***   Next appointment: ***   Medication Adjustments/Labs and Tests Ordered: Current medicines are reviewed at length with the patient today.  Concerns regarding medicines are outlined above.  No orders of the defined types were placed in this encounter.  No orders of the defined types were placed in this encounter.   No chief complaint on file.   History of Present Illness:    James Armstrong is a 81 y.o. male with a hx of CAD with CABG in 2003 and PCI most recently May 2021 vein graft to the posterior descending artery, hypertension hyperlipidemia history of stroke with CEA and abdominal aortic aneurysm last seen by me 01/11/2021.  Compliance with diet, lifestyle and medications: ***  He had a duplex abdominal aorta performed 01/27/2022 largest measurement infrarenal 4.4 cm increased from previous of 3.9. Carotid duplex 07/15/2021: Carotid Arterial Duplex Study   Patient Name:  James Armstrong  Date of Exam:   07/15/2021  Medical Rec #: [id number]         Accession #:    [id number]  Date of Birth: 1941-03-05         Patient Gender: M  Patient Age:   81 years  Exam Location:  [place]  Procedure:      VAS US CAROTID  Referring Phys: Norman Herrlich    ---------------------------------------------------------------------------  -----    Indications:      Stenosis of carotid artery, unspecified laterality  [I65.29                    (ICD-10-CM.  Risk Factors:      Hypertension, prior CVA.  Comparison Study:  No significant change since previous exam performed  05/16/18   Performing Technologist: Fayrene Fearing Reel RDCS     Examination Guidelines: A complete evaluation includes  B-mode imaging,  spectral  Doppler, color Doppler, and power Doppler as needed of all accessible  portions  of each vessel. Bilateral testing is considered an integral part of a  complete  examination. Limited examinations for reoccurring indications may be  performed  as noted.     Right Carotid Findings:  +----------+--------+--------+--------+------------------+--------+           PSV cm/sEDV cm/sStenosisPlaque DescriptionComments  +----------+--------+--------+--------+------------------+--------+  CCA Prox  110     26                                          +----------+--------+--------+--------+------------------+--------+  CCA Distal76      17      <50%    calcific                    +----------+--------+--------+--------+------------------+--------+  ICA Prox  94      32      1-39%   calcific          tortuous  +----------+--------+--------+--------+------------------+--------+  ICA Mid   133     43      40-59%  calcific          tortuous  +----------+--------+--------+--------+------------------+--------+  ICA Distal118     36                                          +----------+--------+--------+--------+------------------+--------+  ECA      133     8                                           +----------+--------+--------+--------+------------------+--------+   +----------+--------+-------+----------------+-------------------+           PSV cm/sEDV cmsDescribe        Arm Pressure (mmHG)  +----------+--------+-------+----------------+-------------------+  [id number]           Multiphasic, WNL                     +----------+--------+-------+----------------+-------------------+   +---------+--------+--------+--------------+  VertebralPSV cm/sEDV cm/sNot identified  +---------+--------+--------+--------------+      Left Carotid Findings:   +----------+--------+--------+--------+------------------+--------+           PSV cm/sEDV cm/sStenosisPlaque DescriptionComments  +----------+--------+--------+--------+------------------+--------+  CCA Prox  83      19                                          +----------+--------+--------+--------+------------------+--------+  CCA Distal93      20      <50%    calcific                    +----------+--------+--------+--------+------------------+--------+  ICA Prox  107     35      1-39%   calcific                    +----------+--------+--------+--------+------------------+--------+  ICA Mid   114     38      1-39%   calcific          tortuous  +----------+--------+--------+--------+------------------+--------+  ICA Distal116     42      40-59%  calcific          tortuous  +----------+--------+--------+--------+------------------+--------+  ECA      111     13              calcific                    +----------+--------+--------+--------+------------------+--------+   +----------+--------+--------+--------+-------------------+           PSV cm/sEDV cm/sDescribeArm Pressure (mmHG)  +----------+--------+--------+--------+-------------------+  [id number]                                         +----------+--------+--------+--------+-------------------+   +---------+--------+--+--------+--+---------+  VertebralPSV cm/s67EDV cm/s23Antegrade  +---------+--------+--+--------+--+---------+         Summary:  Right Carotid: Velocities in the right ICA are consistent with a 40-59%                 stenosis. Non-hemodynamically significant plaque <50% noted  in                the CCA.   Left Carotid: Velocities in the left ICA are consistent with a 40-59%  stenosis.  Non-hemodynamically significant plaque <50% noted in the  CCA.   Vertebrals: Left vertebral artery demonstrates antegrade flow. Right   vertebral              artery was not visualized.  Subclavians: Normal flow hemodynamics were seen in bilateral subclavian               arteries.   Past Medical History:  Diagnosis Date   Abdominal aortic aneurysm 02/24/2016   Abdominal aortic aneurysm (AAA) 30 to 34 mm in diameter    Asthma    Carotid stenosis, bilateral 09/25/2018   Chest pain 02/22/2017   Coronary artery disease involving native coronary artery of native heart with angina pectoris (HCC) 02/24/2016   CABG in 2004  coronary angiography done 08/09/16 Abnormal Diagnostic Summary Chronic Total Occlusion of the RCA Severe stenosis of the LM, LAD, Circumflex Patent LIMA graft to the mid LAD. Patent SVG graft to the Ramus coronary artery. Moderate disease of SVG graft to the Posterior descending coronary artery. LV not done due to tortuosity of right subclavian Interventional Summary Successful PCI / Resolute Drug Eluting Stent of the distal Left Main/Ostial Circumflex Coronary Artery.       Coronary artery disease involving native heart with unstable angina pectoris (HCC) 02/24/2016   CABG in 2004  coronary angiography done 08/09/16 Abnormal Diagnostic Summary Chronic Total Occlusion of the RCA Severe stenosis of the LM, LAD, Circumflex Patent LIMA graft to the mid LAD. Patent SVG graft to the Ramus coronary artery. Moderate disease of SVG graft to the Posterior descending coronary artery. LV not done due to tortuosity of right subclavian Interventional Summary Successful PCI /   Crohn disease (HCC)    Essential hypertension 02/24/2016   GERD (gastroesophageal reflux disease)    Hx of CABG 02/24/2016   Overview:  In 2003   Hyperlipemia 02/24/2016   Mixed dyslipidemia 07/15/2017   Multiple vessel coronary artery disease 07/15/2017   Myopathy 02/01/2018   Presence of stent in coronary artery in patient with coronary artery disease 07/15/2017   Shortness of breath 08/06/2016   Sinus bradycardia 08/06/2016   Stroke [facility])     Past Surgical  History:  Procedure Laterality Date   CARDIAC CATHETERIZATION     CAROTID ENDARTERECTOMY     CHOLECYSTECTOMY     CORONARY ARTERY BYPASS GRAFT     2004   CORONARY STENT INTERVENTION N/A 11/19/2019   Procedure: CORONARY STENT INTERVENTION;  Surgeon: Tonny Bollman, MD;  Location: [facility] INVASIVE CV LAB;  Service: Cardiovascular;  Laterality: N/A;   HERNIA REPAIR     LEFT HEART CATH AND CORS/GRAFTS ANGIOGRAPHY N/A 07/26/2017   Procedure: LEFT HEART CATH AND CORS/GRAFTS ANGIOGRAPHY;  Surgeon: Kathleene Hazel, MD;  Location: [facility] INVASIVE CV LAB;  Service: Cardiovascular;  Laterality: N/A;   LEFT HEART CATH AND CORS/GRAFTS ANGIOGRAPHY N/A 11/19/2019   Procedure: LEFT HEART CATH AND CORS/GRAFTS ANGIOGRAPHY;  Surgeon: Tonny Bollman, MD;  Location: [facility] INVASIVE CV LAB;  Service: Cardiovascular;  Laterality: N/A;    Current Medications: No outpatient medications have been marked as taking for the 05/05/22 encounter (Appointment) with Baldo Daub, MD.     Allergies:   Atorvastatin, Pravastatin, Ranexa [ranolazine], and Simvastatin   Social History   Socioeconomic History   Marital status: Single    Spouse name: Not on file   Number of children: Not on file   Years of education: Not on file   Highest education level: Not on file  Occupational History  Not on file  Tobacco Use   Smoking status: Never   Smokeless tobacco: Never  Vaping Use   Vaping Use: Never used  Substance and Sexual Activity   Alcohol use: No   Drug use: No   Sexual activity: Not on file  Other Topics Concern   Not on file  Social History Narrative   Not on file   Social Determinants of Health   Financial Resource Strain: Not on file  Food Insecurity: Not on file  Transportation Needs: Not on file  Physical Activity: Not on file  Stress: Not on file  Social Connections: Not on file     Family History: The patient's ***family history includes Diabetes in his brother; Heart attack in his brother;  Stroke in his mother. ROS:   Please see the history of present illness.    All other systems reviewed and are negative.  EKGs/Labs/Other Studies Reviewed:    The following studies were reviewed today:  EKG:  EKG ordered today and personally reviewed.  The ekg ordered today demonstrates ***  Recent Labs: No results found for requested labs within last 365 days.  Recent Lipid Panel    Component Value Date/Time   CHOL 173 06/19/2018 0805   TRIG 102 06/19/2018 0805   HDL 41 06/19/2018 0805   CHOLHDL 4.2 06/19/2018 0805   LDLCALC 112 (H) 06/19/2018 0805    Physical Exam:    VS:  There were no vitals taken for this visit.    Wt Readings from Last 3 Encounters:  08/04/21 163 lb 9.6 oz (74.2 kg)  01/11/21 162 lb 12.8 oz (73.8 kg)  07/15/20 163 lb (73.9 kg)     GEN: *** Well nourished, well developed in no acute distress HEENT: Normal NECK: No JVD; No carotid bruits LYMPHATICS: No lymphadenopathy CARDIAC: ***RRR, no murmurs, rubs, gallops RESPIRATORY:  Clear to auscultation without rales, wheezing or rhonchi  ABDOMEN: Soft, non-tender, non-distended MUSCULOSKELETAL:  No edema; No deformity  SKIN: Warm and dry NEUROLOGIC:  Alert and oriented x 3 PSYCHIATRIC:  Normal affect    Signed, Norman Herrlich, MD  05/05/2022 7:16 AM    [facility]

## 2022-05-11 ENCOUNTER — Encounter: Payer: Self-pay | Admitting: Cardiology

## 2022-05-19 DIAGNOSIS — I714 Abdominal aortic aneurysm, without rupture, unspecified: Secondary | ICD-10-CM | POA: Diagnosis not present

## 2022-05-19 DIAGNOSIS — F3342 Major depressive disorder, recurrent, in full remission: Secondary | ICD-10-CM | POA: Diagnosis not present

## 2022-05-19 DIAGNOSIS — I10 Essential (primary) hypertension: Secondary | ICD-10-CM | POA: Diagnosis not present

## 2022-05-19 DIAGNOSIS — I25700 Atherosclerosis of coronary artery bypass graft(s), unspecified, with unstable angina pectoris: Secondary | ICD-10-CM | POA: Diagnosis not present

## 2022-05-19 DIAGNOSIS — E785 Hyperlipidemia, unspecified: Secondary | ICD-10-CM | POA: Diagnosis not present

## 2022-05-25 DIAGNOSIS — E785 Hyperlipidemia, unspecified: Secondary | ICD-10-CM | POA: Diagnosis not present

## 2022-05-25 DIAGNOSIS — I25700 Atherosclerosis of coronary artery bypass graft(s), unspecified, with unstable angina pectoris: Secondary | ICD-10-CM | POA: Diagnosis not present

## 2022-05-25 DIAGNOSIS — I10 Essential (primary) hypertension: Secondary | ICD-10-CM | POA: Diagnosis not present

## 2022-05-25 DIAGNOSIS — F3342 Major depressive disorder, recurrent, in full remission: Secondary | ICD-10-CM | POA: Diagnosis not present

## 2022-05-25 DIAGNOSIS — I714 Abdominal aortic aneurysm, without rupture, unspecified: Secondary | ICD-10-CM | POA: Diagnosis not present

## 2022-06-01 DIAGNOSIS — I10 Essential (primary) hypertension: Secondary | ICD-10-CM | POA: Diagnosis not present

## 2022-06-01 DIAGNOSIS — F3342 Major depressive disorder, recurrent, in full remission: Secondary | ICD-10-CM | POA: Diagnosis not present

## 2022-06-01 DIAGNOSIS — E785 Hyperlipidemia, unspecified: Secondary | ICD-10-CM | POA: Diagnosis not present

## 2022-06-01 DIAGNOSIS — I25700 Atherosclerosis of coronary artery bypass graft(s), unspecified, with unstable angina pectoris: Secondary | ICD-10-CM | POA: Diagnosis not present

## 2022-06-02 DIAGNOSIS — I209 Angina pectoris, unspecified: Secondary | ICD-10-CM | POA: Diagnosis not present

## 2022-06-02 DIAGNOSIS — I10 Essential (primary) hypertension: Secondary | ICD-10-CM | POA: Diagnosis not present

## 2022-06-02 DIAGNOSIS — K219 Gastro-esophageal reflux disease without esophagitis: Secondary | ICD-10-CM | POA: Diagnosis not present

## 2022-06-02 DIAGNOSIS — E785 Hyperlipidemia, unspecified: Secondary | ICD-10-CM | POA: Diagnosis not present

## 2022-06-23 IMAGING — CT CT ABDOMEN W/O CM
2 of 4 series · 15 of 46 positions shown, 17 images · non-contrast
Comparison: CT abdomen pelvis, 05/04/2021

CLINICAL DATA: Follow-up abdominal aortic aneurysm

EXAM:
CT ABDOMEN WITHOUT CONTRAST
TECHNIQUE: Multidetector CT imaging of the abdomen was performed following the
standard protocol without IV contrast. Oral enteric contrast was
administered.
RADIATION DOSE REDUCTION: This exam was performed according to the
departmental dose-optimization program which includes automated
exposure control, adjustment of the mA and/or kV according to
patient size and/or use of iterative reconstruction technique.

[Series 2: axial st · axial · 0.81mm/px · z∈[-196,+4]mm · 12 of 46 slices shown, 14 images]
[im 3/46  soft-tissue]
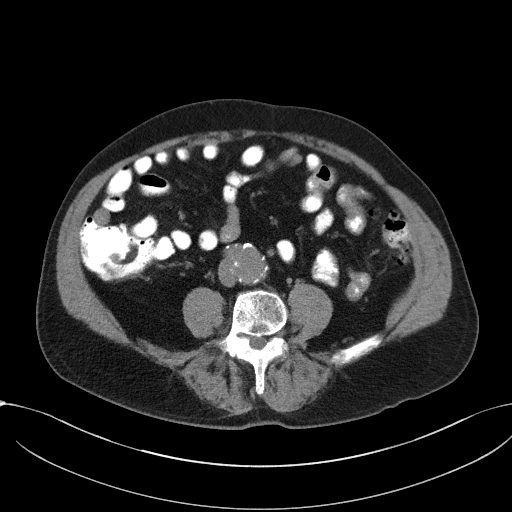
[im 3/46  bone]
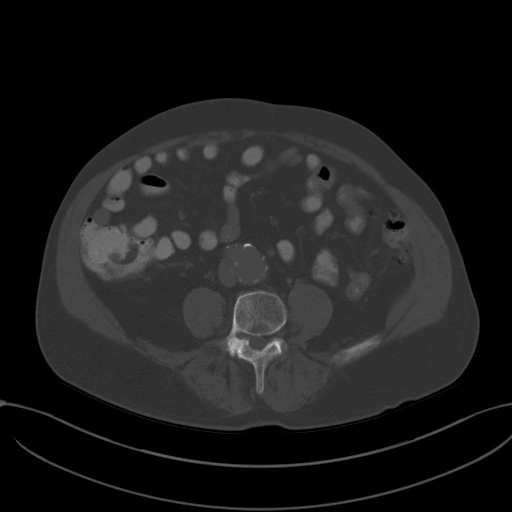
[im 7/46  soft-tissue]
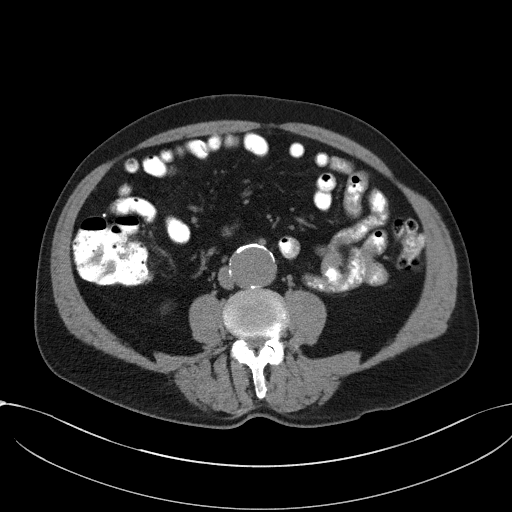
[im 11/46  soft-tissue]
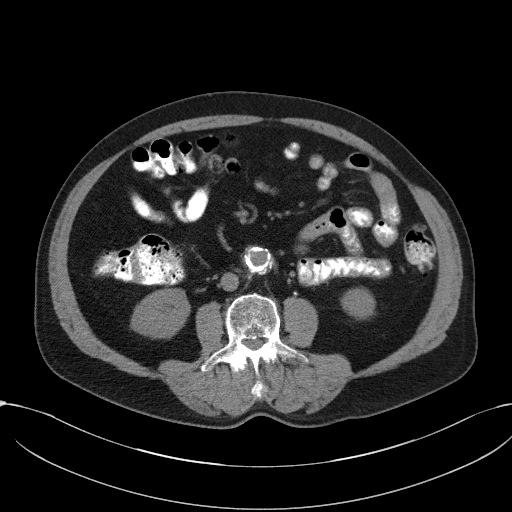
[im 15/46  soft-tissue]
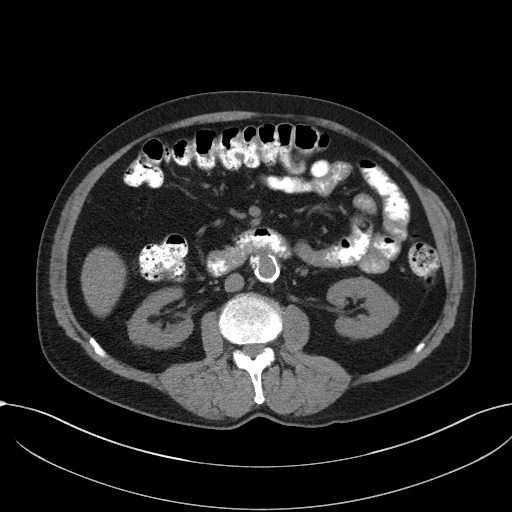
[im 17/46  soft-tissue]
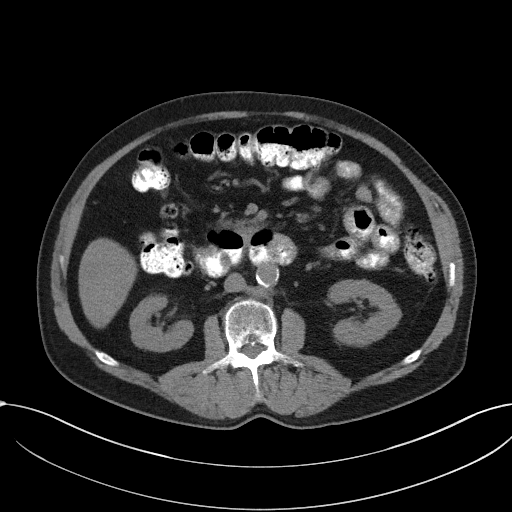
[im 21/46  soft-tissue]
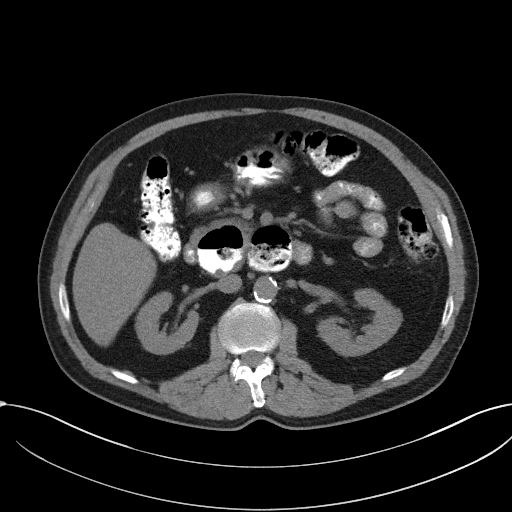
[im 25/46  soft-tissue]
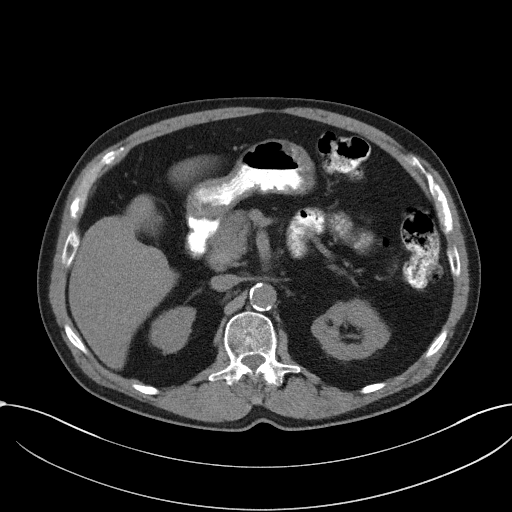
[im 29/46  soft-tissue]
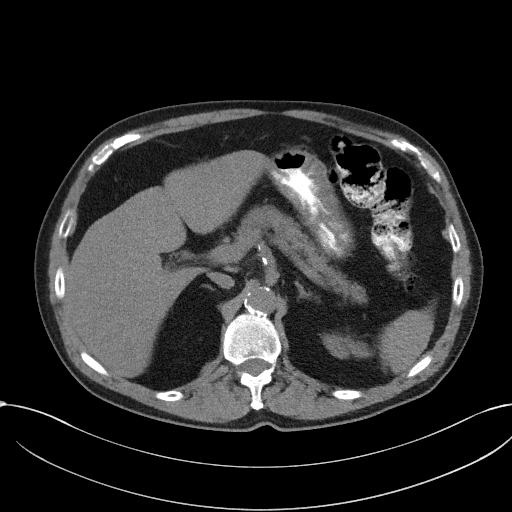
[im 31/46  soft-tissue]
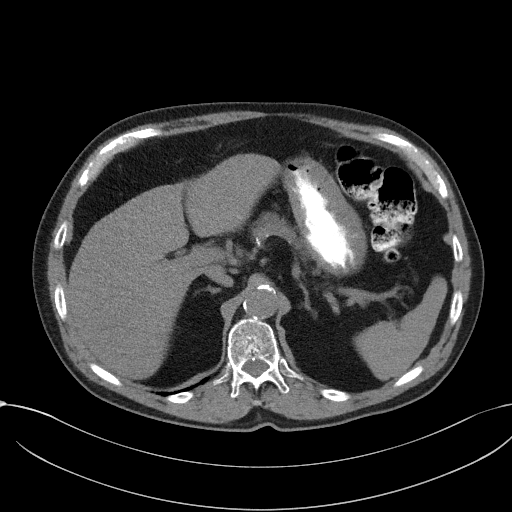
[im 31/46  bone]
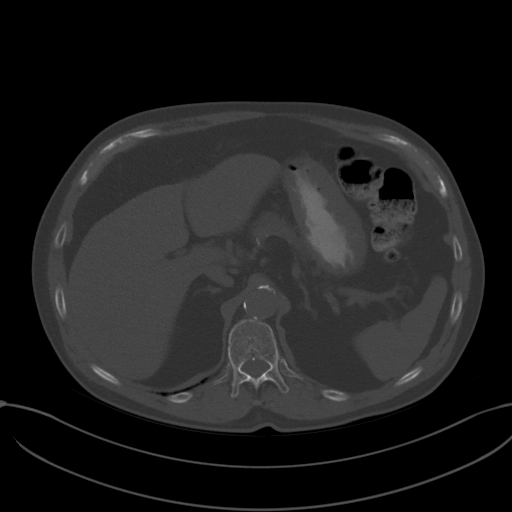
[im 35/46  soft-tissue]
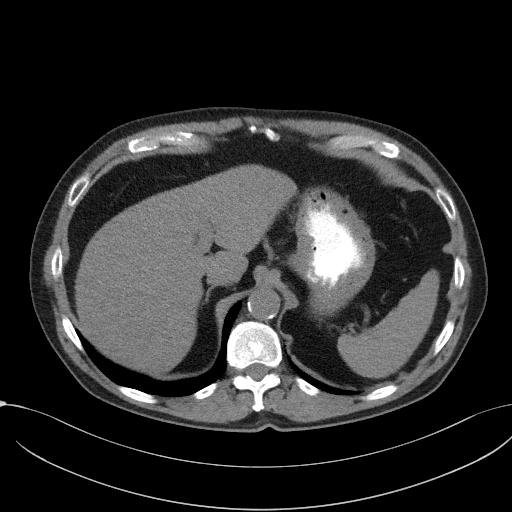
[im 39/46  soft-tissue]
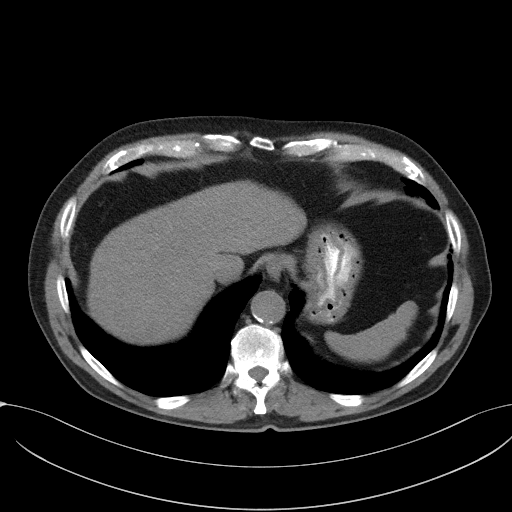
[im 43/46  soft-tissue]
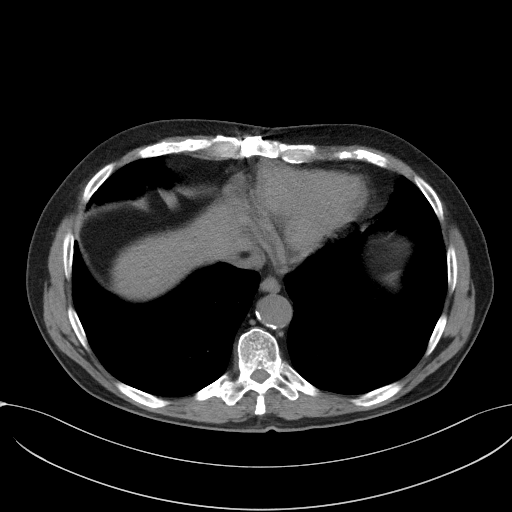

[Series 5: coronal st · coronal · 0.50mm/px · 3 of 91 slices shown]
[im 31/91  soft-tissue]
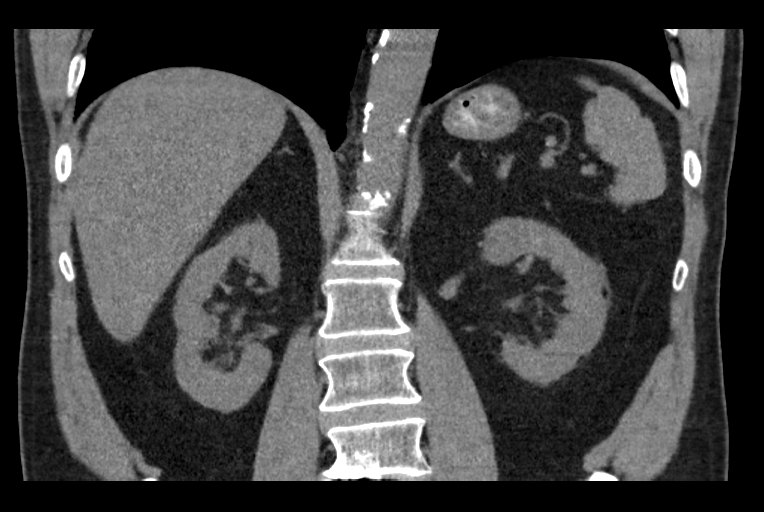
[im 41/91  soft-tissue]
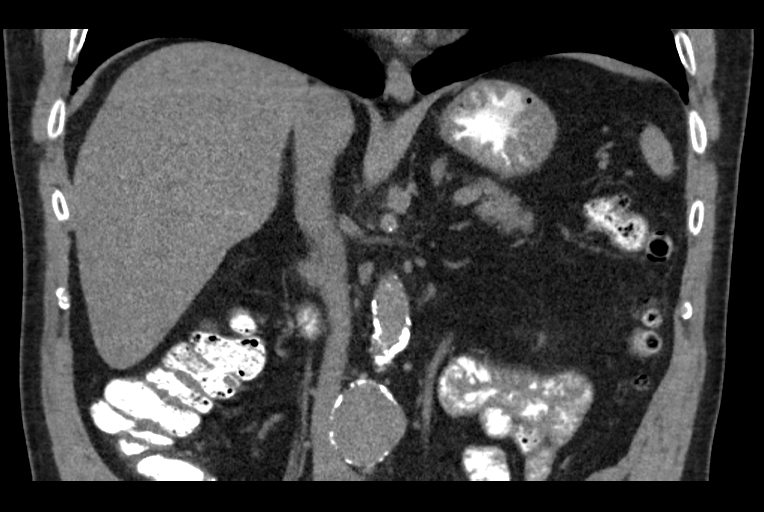
[im 51/91  soft-tissue]
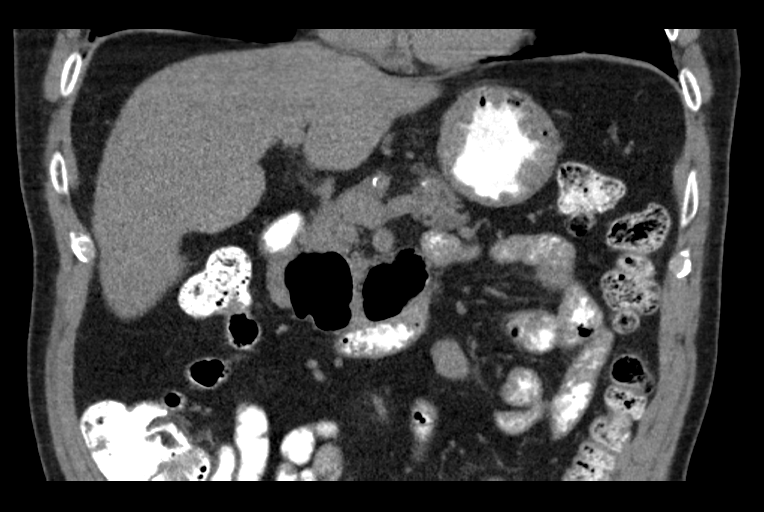

[15 of 46 positions shown; findings below may reference images not displayed]

FINDINGS: Lower chest: No acute abnormality.

Hepatobiliary: No focal liver abnormality is seen. Hepatic
steatosis. Status post cholecystectomy. No biliary dilatation.

Pancreas: Unremarkable. No pancreatic ductal dilatation or
surrounding inflammatory changes.

Spleen: Normal in size without significant abnormality.

Adrenals/Urinary Tract: Adrenal glands are unremarkable. Punctuate
calculus of the proximal third of the left ureter measuring no
greater than 0.2 cm, without associated hydronephrosis or
hydroureter. No additional calculi identified. Bladder is
unremarkable.

Stomach/Bowel: Stomach is within normal limits. Incidental
diverticula of the descending and transverse duodenum (series 2,
image 28). Appendix appears normal. No evidence of bowel wall
thickening, distention, or inflammatory changes. Descending colonic
diverticulosis

Vascular/Lymphatic: Aortic atherosclerosis. Unchanged aneurysm of
the infrarenal abdominal aorta measuring 3.8 x 3.6 cm (series 2,
image 40). No enlarged abdominal lymph nodes.

Reproductive: No mass or other significant abnormality.

Other: No abdominal wall hernia or abnormality. No ascites.

Musculoskeletal: No acute or significant osseous findings.
IMPRESSION: 1. Unchanged aneurysm of the infrarenal abdominal aorta measuring
3.8 x 3.6 cm. Recommend follow-up ultrasound every 2 years. This
recommendation follows ACR consensus guidelines: White Paper of the
ACR Incidental Findings Committee II on Vascular Findings. [HOSPITAL] 2687; [DATE].
2. Punctuate calculus of the proximal third of the left ureter
measuring no greater than 0.2 cm, without associated hydronephrosis
or hydroureter. No additional calculi identified.
3. Hepatic steatosis.

Aortic Atherosclerosis ([id number].X).
